# Patient Record
Sex: Female | Born: 1950 | Race: White | Hispanic: No | Marital: Single | Smoking: Former smoker
Health system: Southern US, Community
[De-identification: ages and names within clinical notes are randomized; demographics above are authoritative.]

## PROBLEM LIST (undated history)

## (undated) DIAGNOSIS — M1990 Unspecified osteoarthritis, unspecified site: Secondary | ICD-10-CM

## (undated) DIAGNOSIS — Z8489 Family history of other specified conditions: Secondary | ICD-10-CM

## (undated) DIAGNOSIS — T753XXA Motion sickness, initial encounter: Secondary | ICD-10-CM

## (undated) DIAGNOSIS — C50919 Malignant neoplasm of unspecified site of unspecified female breast: Secondary | ICD-10-CM

## (undated) DIAGNOSIS — K589 Irritable bowel syndrome without diarrhea: Secondary | ICD-10-CM

## (undated) DIAGNOSIS — R112 Nausea with vomiting, unspecified: Secondary | ICD-10-CM

## (undated) DIAGNOSIS — F419 Anxiety disorder, unspecified: Secondary | ICD-10-CM

## (undated) DIAGNOSIS — E278 Other specified disorders of adrenal gland: Secondary | ICD-10-CM

## (undated) DIAGNOSIS — Z9289 Personal history of other medical treatment: Secondary | ICD-10-CM

## (undated) DIAGNOSIS — G629 Polyneuropathy, unspecified: Secondary | ICD-10-CM

## (undated) DIAGNOSIS — J302 Other seasonal allergic rhinitis: Secondary | ICD-10-CM

## (undated) DIAGNOSIS — I890 Lymphedema, not elsewhere classified: Secondary | ICD-10-CM

## (undated) DIAGNOSIS — D126 Benign neoplasm of colon, unspecified: Secondary | ICD-10-CM

## (undated) DIAGNOSIS — C4490 Unspecified malignant neoplasm of skin, unspecified: Secondary | ICD-10-CM

## (undated) DIAGNOSIS — N200 Calculus of kidney: Secondary | ICD-10-CM

## (undated) DIAGNOSIS — K219 Gastro-esophageal reflux disease without esophagitis: Secondary | ICD-10-CM

## (undated) DIAGNOSIS — I10 Essential (primary) hypertension: Secondary | ICD-10-CM

## (undated) DIAGNOSIS — K862 Cyst of pancreas: Secondary | ICD-10-CM

## (undated) DIAGNOSIS — E119 Type 2 diabetes mellitus without complications: Secondary | ICD-10-CM

## (undated) DIAGNOSIS — E7800 Pure hypercholesterolemia, unspecified: Secondary | ICD-10-CM

## (undated) DIAGNOSIS — K449 Diaphragmatic hernia without obstruction or gangrene: Secondary | ICD-10-CM

## (undated) HISTORY — PX: ABDOMINAL HYSTERECTOMY: SHX81

## (undated) HISTORY — PX: COLONOSCOPY: SHX174

## (undated) HISTORY — PX: MASTECTOMY: SHX3

## (undated) HISTORY — PX: TUBAL LIGATION: SHX77

## (undated) HISTORY — PX: BREAST LUMPECTOMY: SHX2

## (undated) HISTORY — PX: TONSILLECTOMY: SUR1361

## (undated) HISTORY — PX: MOHS SURGERY: SUR867

## (undated) HISTORY — PX: INSERTION CENTRAL VENOUS ACCESS DEVICE W/ SUBCUTANEOUS PORT: SUR725

## (undated) HISTORY — PX: SKIN CANCER EXCISION: SHX779

---

## 2007-01-07 ENCOUNTER — Ambulatory Visit: Payer: Self-pay | Admitting: Unknown Physician Specialty

## 2007-03-27 ENCOUNTER — Ambulatory Visit: Payer: Self-pay | Admitting: Unknown Physician Specialty

## 2007-05-22 ENCOUNTER — Ambulatory Visit: Payer: Self-pay | Admitting: Unknown Physician Specialty

## 2008-05-17 ENCOUNTER — Emergency Department: Payer: Self-pay | Admitting: Emergency Medicine

## 2008-06-16 ENCOUNTER — Ambulatory Visit: Payer: Self-pay | Admitting: Specialist

## 2008-08-26 IMAGING — RF DG BARIUM SWALLOW
1 series · 15 of 20 positions shown · non-contrast
Comparison: none

REASON FOR EXAM: Dsyphasia  WITH TABLET
COMMENTS:

PROCEDURE:     FL  - FL BARIUM SWALLOW  - January 07, 2007 [DATE]
RESULT:       Small sliding hiatal hernia is present. There is a mild
prominence of a B-ring. Standardized barium pill stayed at the B-ring for
approximately 15 seconds before passing. No reflux is noted.

[Series 1: run · 13 acquisitions, 15 frames shown]
[im 1/13]
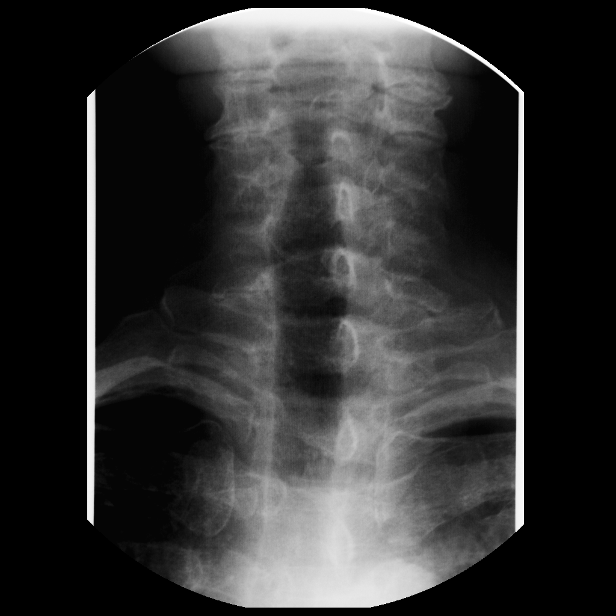
[im 1/13]
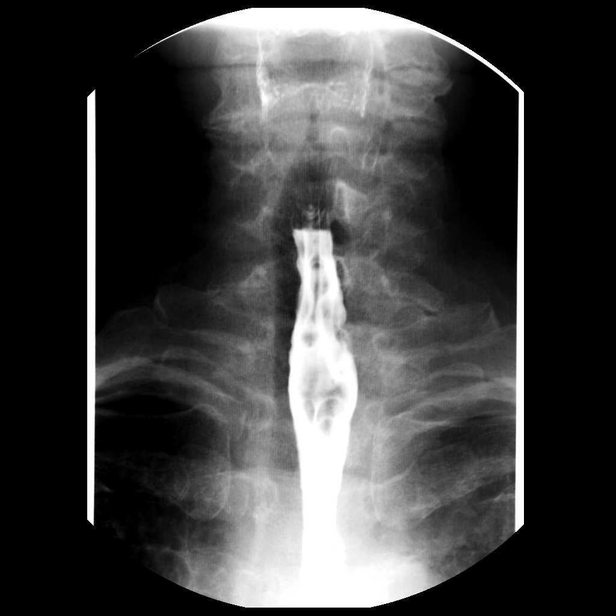
[im 1/13]
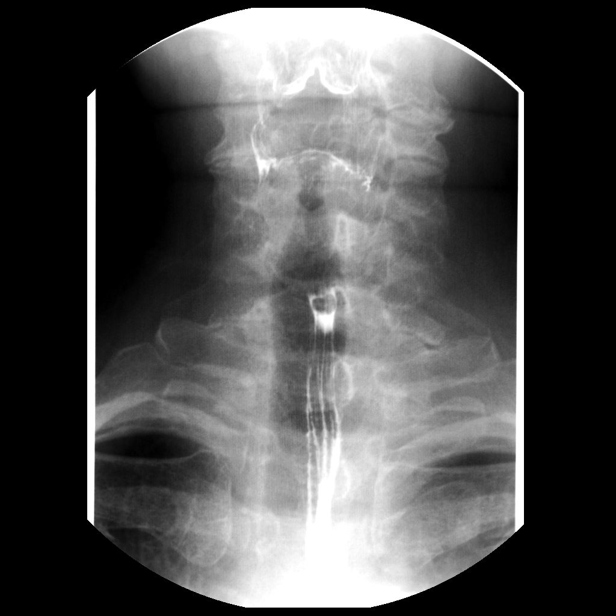
[im 2/13]
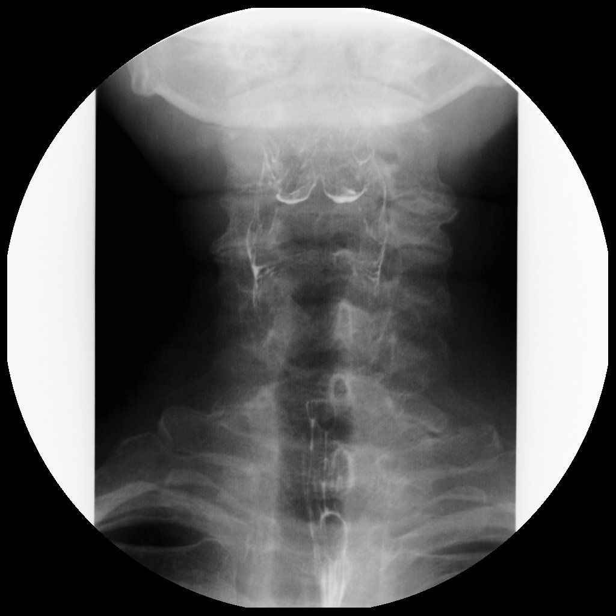
[im 3/13]
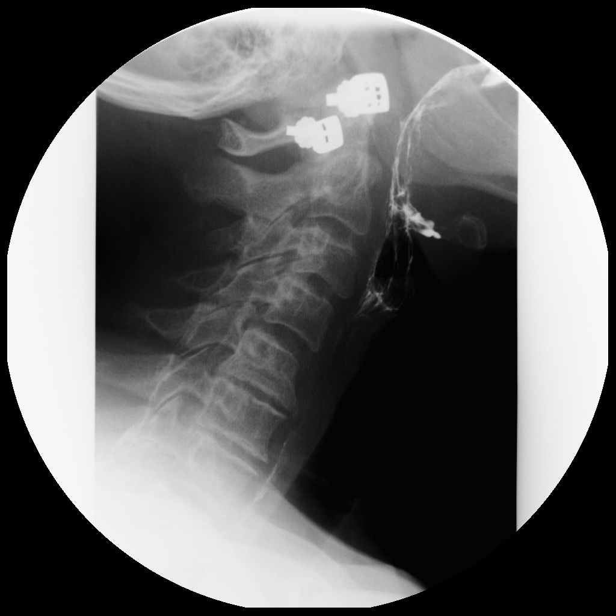
[im 3/13]
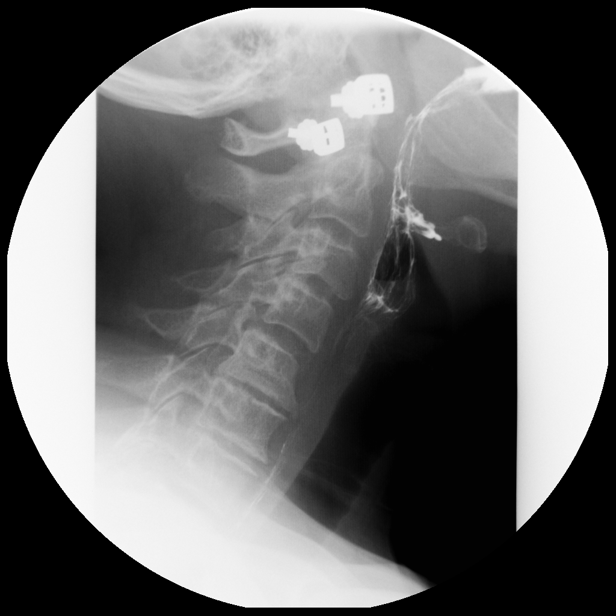
[im 3/13]
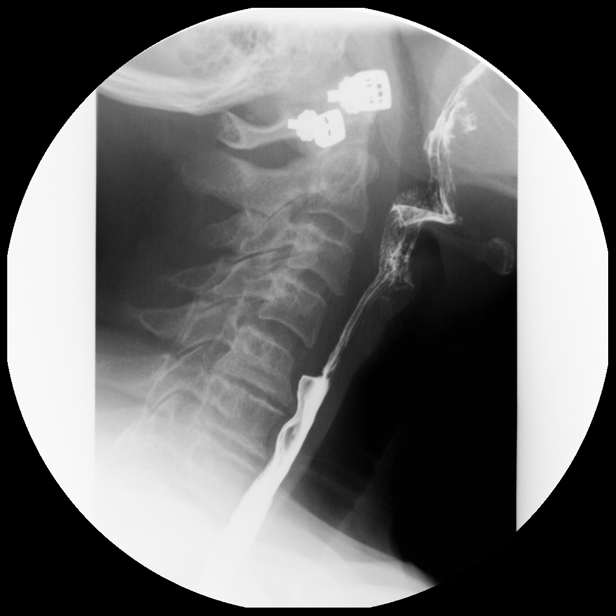
[im 4/13]
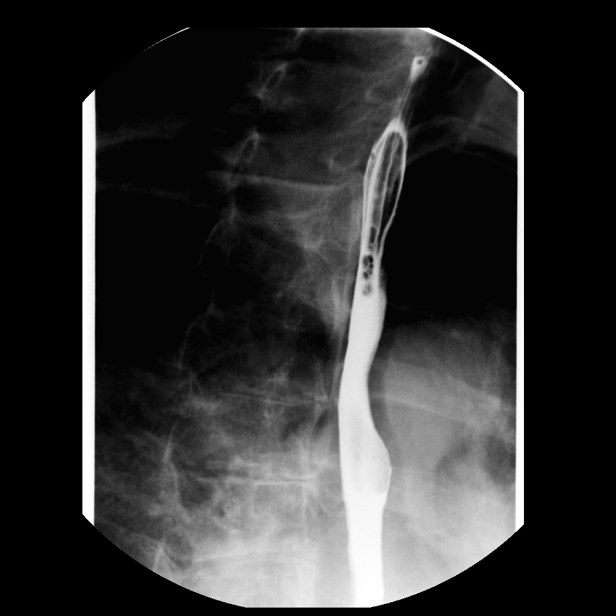
[im 5/13]
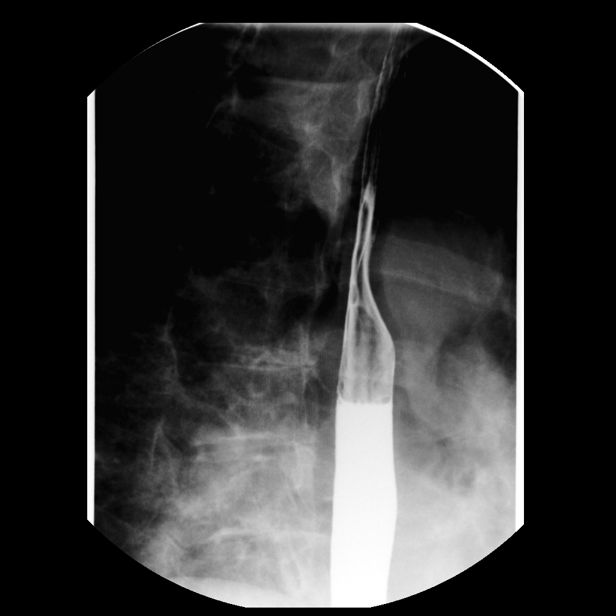
[im 6/13]
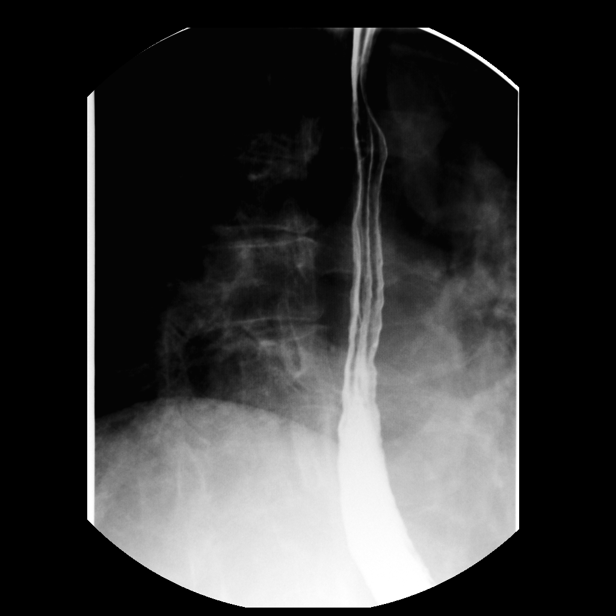
[im 8/13]
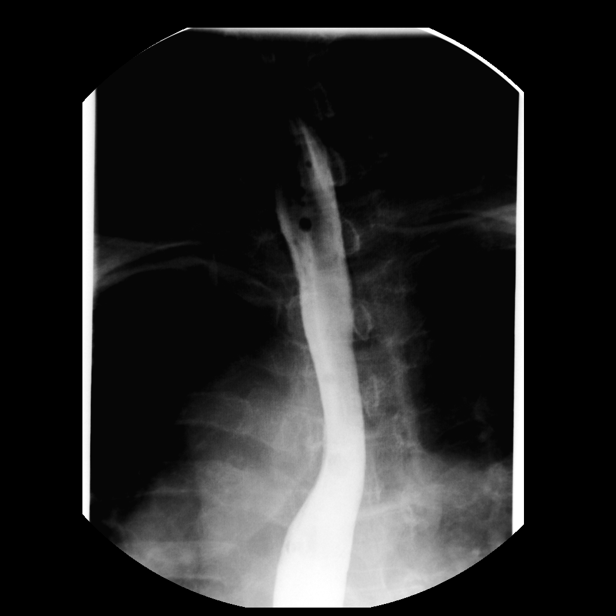
[im 9/13]
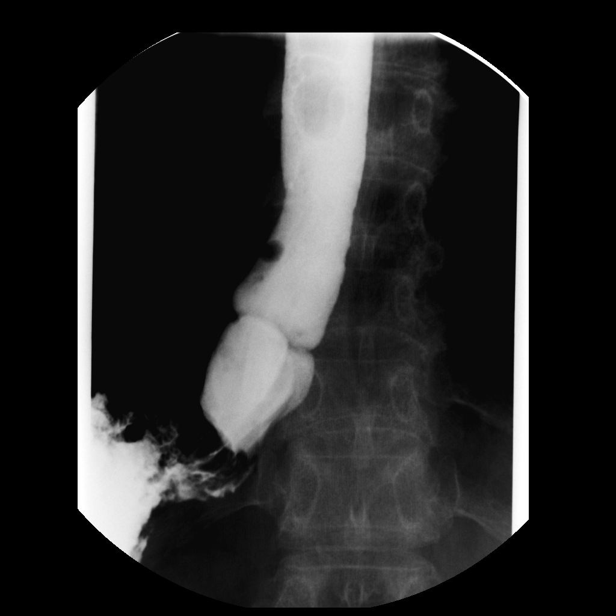
[im 10/13]
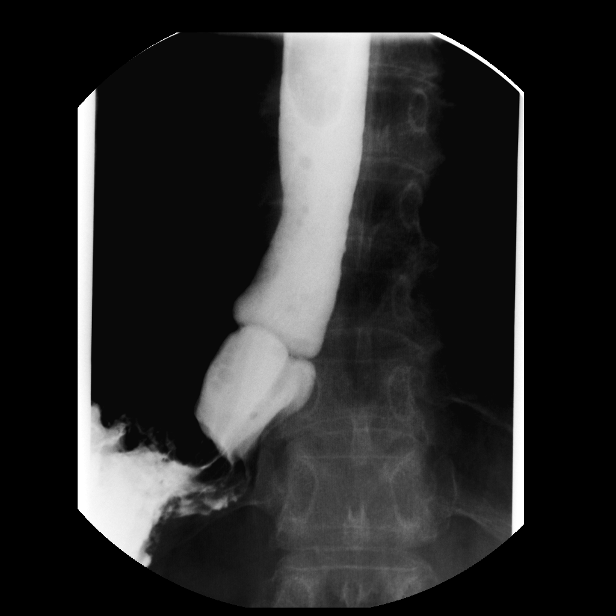
[im 12/13]
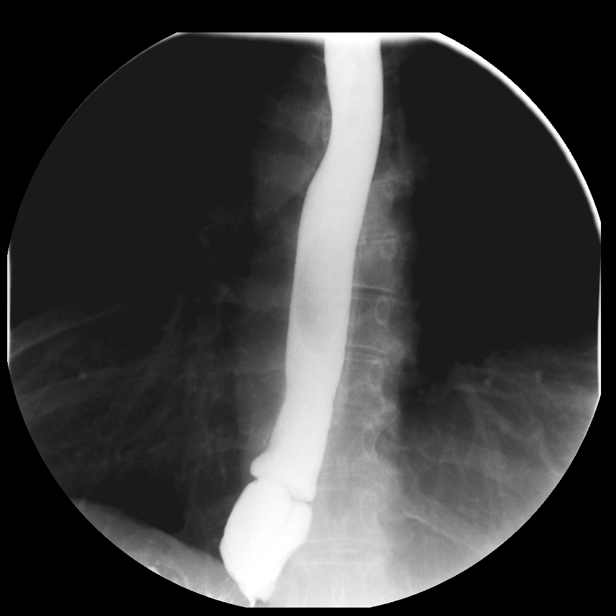
[im 13/13]
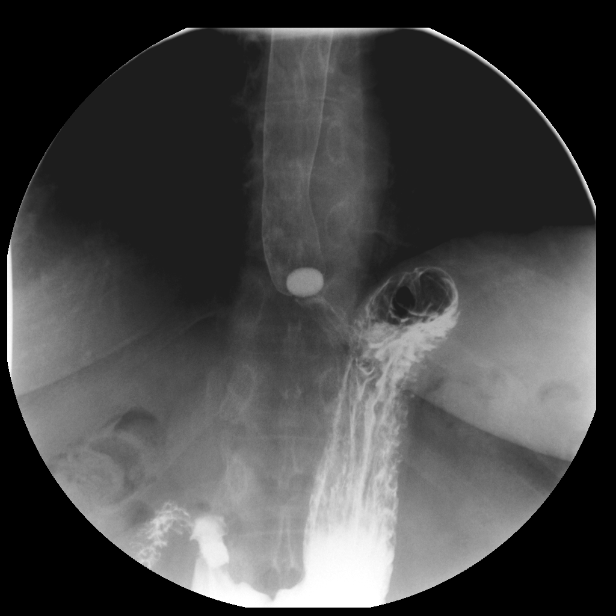

[15 of 20 positions shown; findings below may reference images not displayed]

IMPRESSION: Small hiatal hernia with mild B-ring deformity.  Minimal delay of a barium
tablet was present. No reflux noted. No mass.

## 2008-11-16 ENCOUNTER — Ambulatory Visit: Payer: Self-pay | Admitting: Specialist

## 2009-11-25 ENCOUNTER — Ambulatory Visit: Payer: Self-pay | Admitting: Unknown Physician Specialty

## 2011-03-17 ENCOUNTER — Emergency Department: Payer: Self-pay | Admitting: Internal Medicine

## 2012-11-03 IMAGING — NM NM LUNG SCAN
2 series · 14 of 14 positions shown · non-contrast
Comparison: none

REASON FOR EXAM: cp
COMMENTS:

[Series 1000: lung perfusion · 1.95mm/px · 3 acquisitions, 6 frames shown]
[im 1/3]
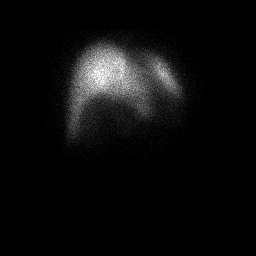
[im 1/3]
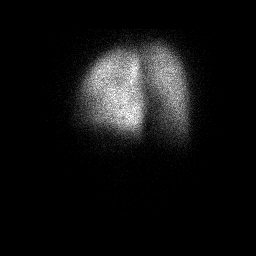
[im 2/3]
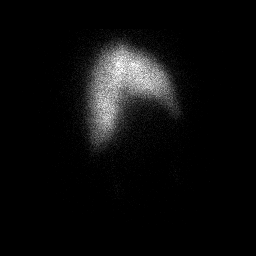
[im 2/3]
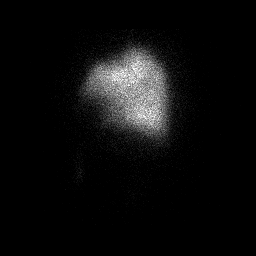
[im 3/3]
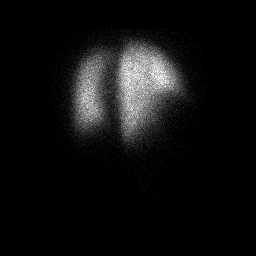
[im 3/3]
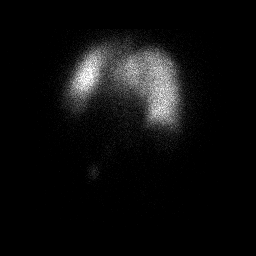

[Series 1000: lung ventilation · 3.90mm/px · 4 acquisitions, 8 frames shown]
[im 1/4]
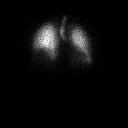
[im 1/4]
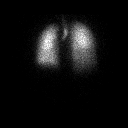
[im 2/4]
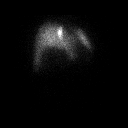
[im 2/4]
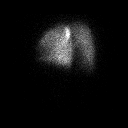
[im 3/4]
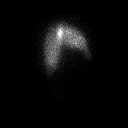
[im 3/4]
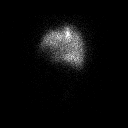
[im 4/4]
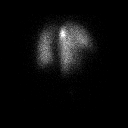
[im 4/4]
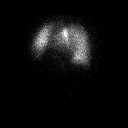

[14 of 14 positions shown; findings below may reference images not displayed]

PROCEDURE:     NM  - NM VQ LUNG SCAN  - [DATE]  [DATE] [DATE]  [DATE]

RESULT:     The patient received 41.546 mCi of technetium 99m labeled
labeled DTPA for the ventilation study via nebulizer. She received 4.373 mCi
of technetium 99m labeled MAA intravenously for the perfusion study.
Comparison is made to a chest x-ray 17 March, 2011.

The distribution  of the radiopharmaceutical on the ventilation study is
within the limits of normal with some central airway activity. The perfusion
images reveal no areas of mismatch.
IMPRESSION: Normal ventilation/perfusion lung scan.

## 2013-02-27 ENCOUNTER — Ambulatory Visit: Payer: Self-pay | Admitting: Family Medicine

## 2014-10-17 IMAGING — US US ABDOMEN LIMITED SLG ORGAN/ASCITES
1 series · 14 of 25 positions shown · non-contrast
Comparison: none

REASON FOR EXAM: elevated alkaline phosphatase   LIVER
COMMENTS:

[Series 1: us abdomen limited slg organ/ascites · 0.28mm/px · 14 of 60 slices shown]
[im 1/60]
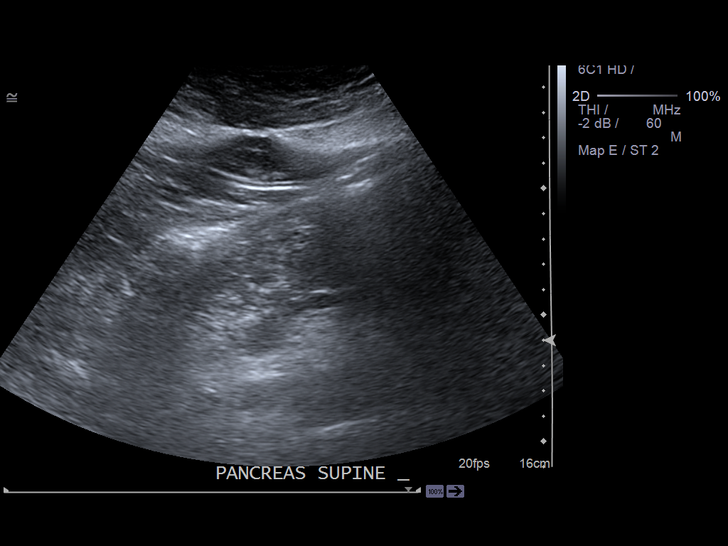
[im 5/60]
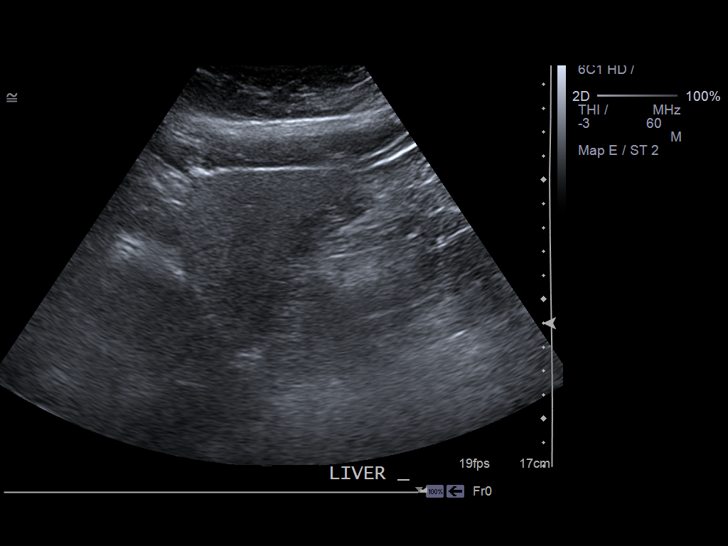
[im 10/60]
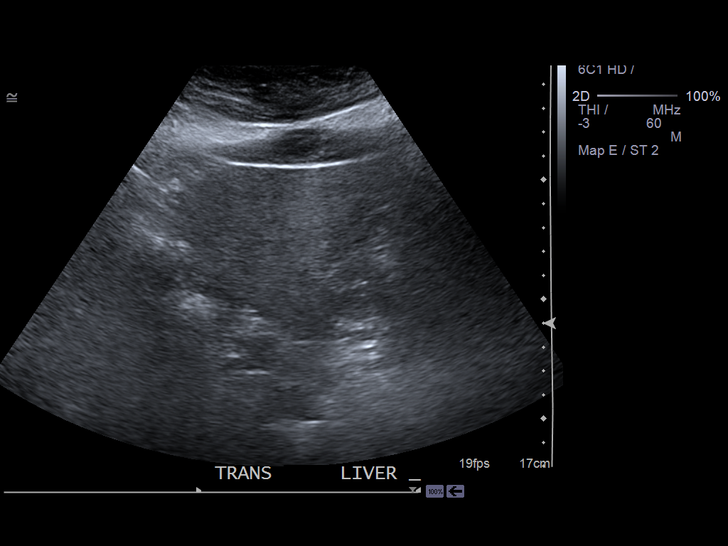
[im 15/60]
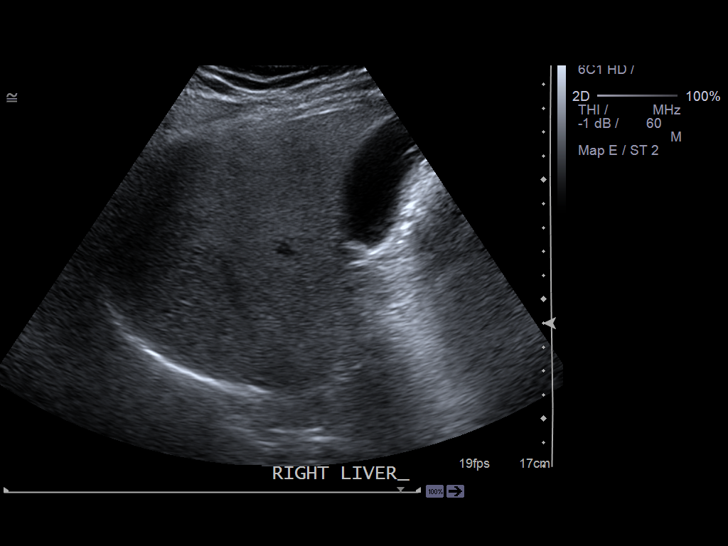
[im 20/60]
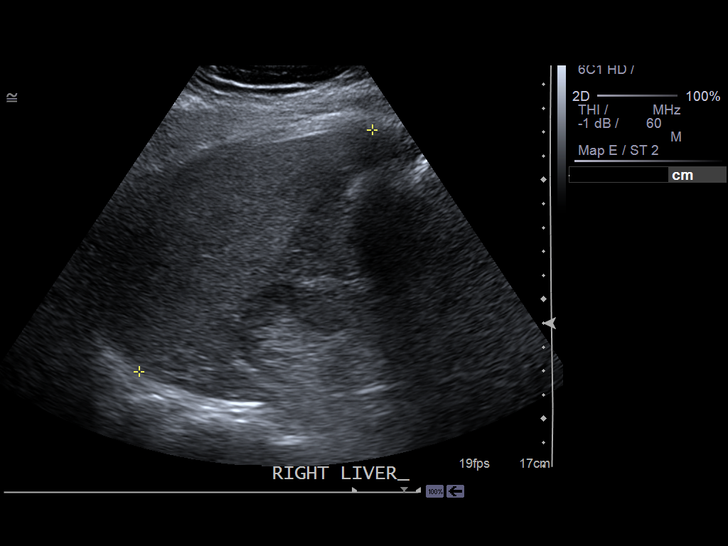
[im 23/60]
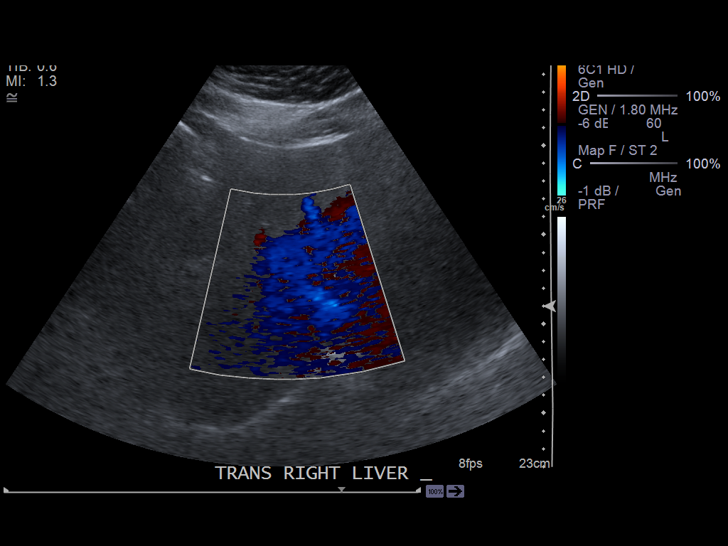
[im 28/60]
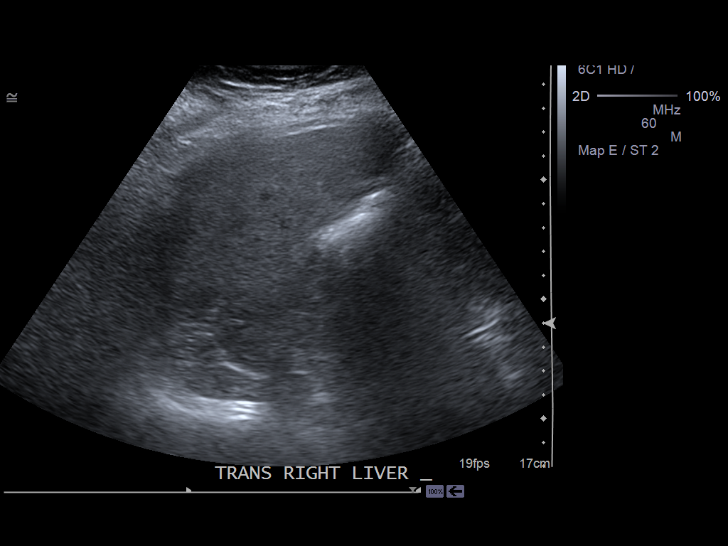
[im 32/60]
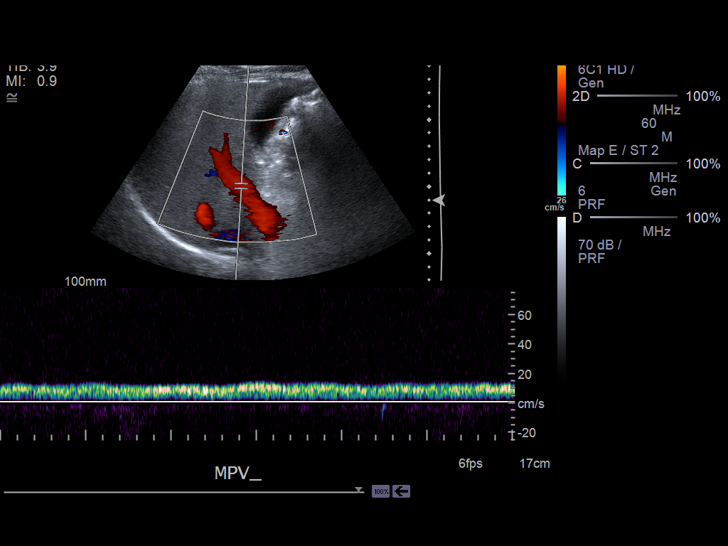
[im 37/60]
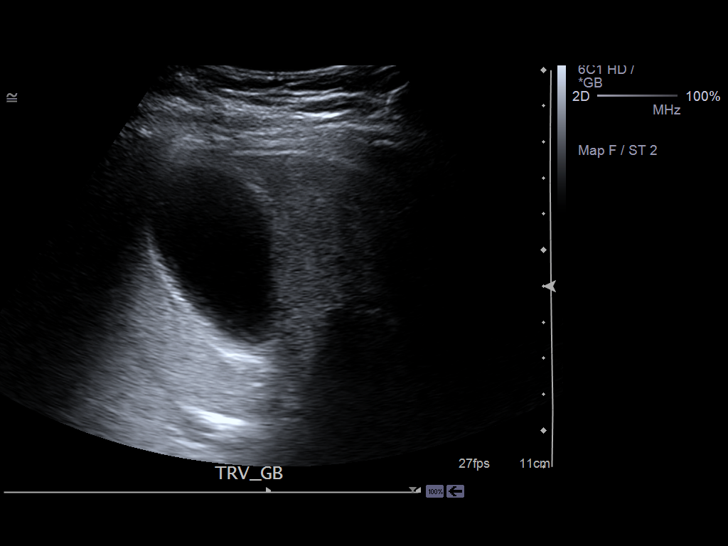
[im 40/60]
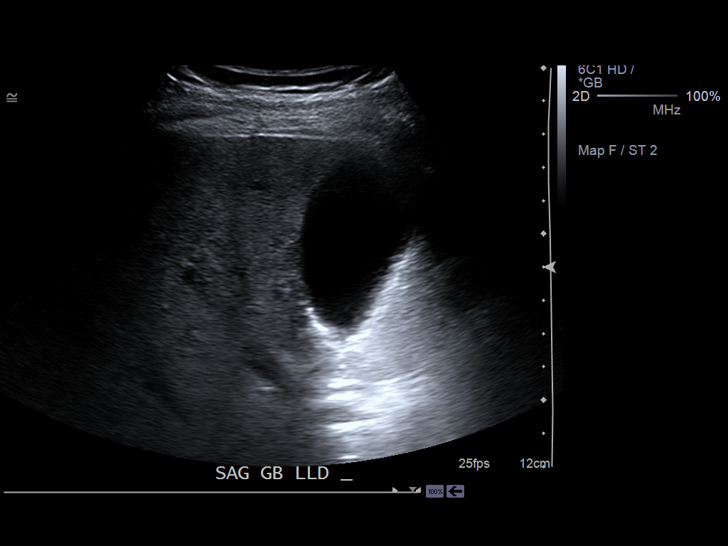
[im 45/60]
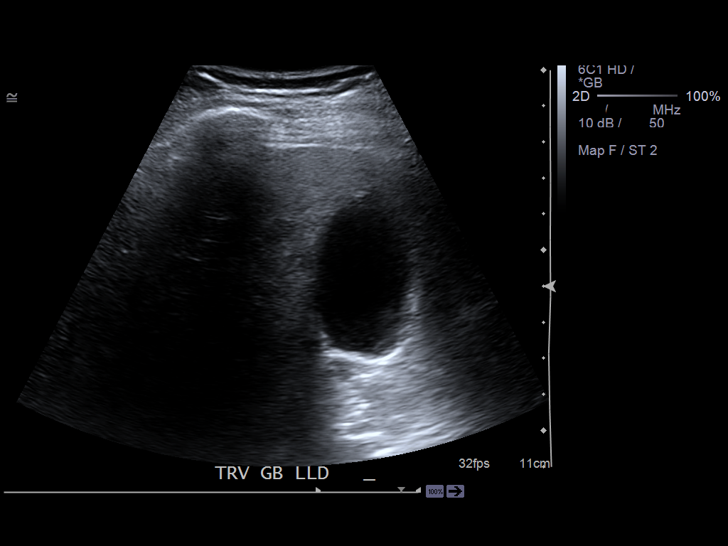
[im 50/60]
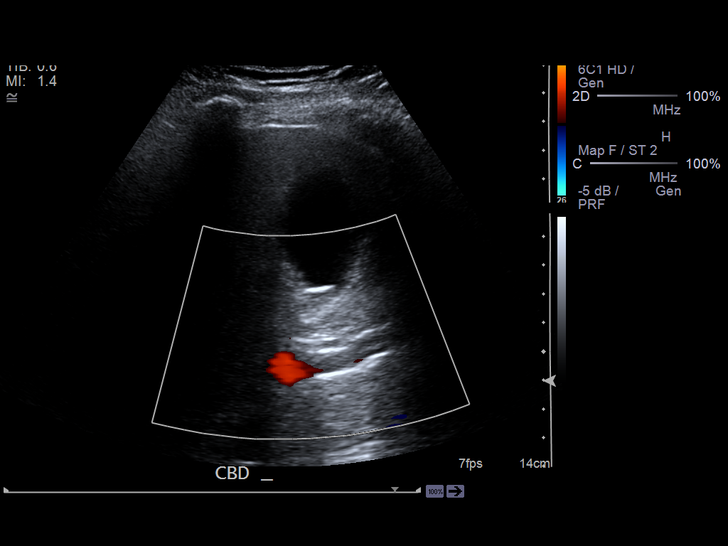
[im 55/60]
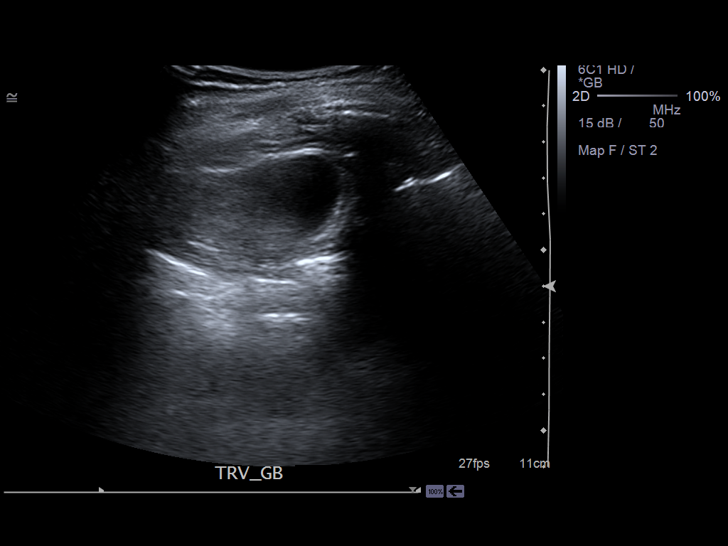
[im 60/60]
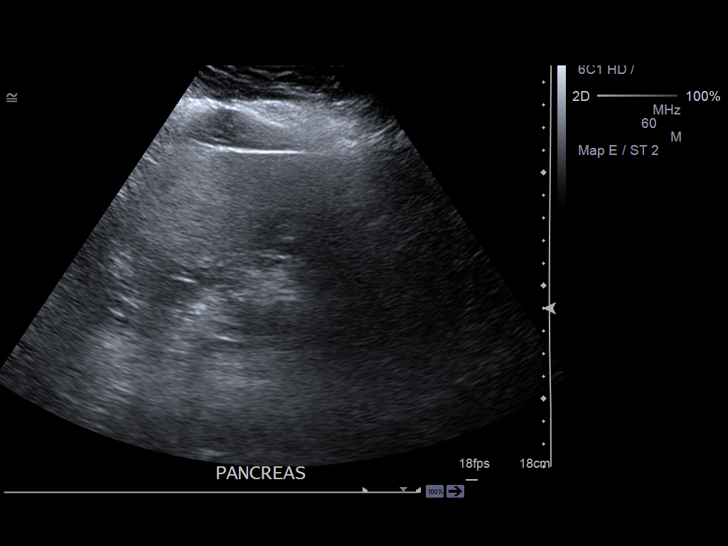

[14 of 25 positions shown; findings below may reference images not displayed]

PROCEDURE:     CHOP KING - CHOP KING ABDOMEN LTD 1 ORGAN OR QUAD  - February 27, 2013  [DATE]

RESULT:     The liver exhibits mildly increased echotexture suggesting fatty
infiltration. Portal venous flow is normal in direction toward the liver.
The gallbladder is adequately distended with no evidence of stones, wall
thickening, or pericholecystic fluid. There is no positive sonographic
Murphy's sign. The common bile duct measures 4 mm in diameter. The pancreas
is largely obscured by bowel gas.
IMPRESSION: 1. There are fatty infiltrative changes of the liver.
2. The gallbladder and common bile duct are normal in appearance.
3. The pancreas is largely obscured by bowel gas.

[REDACTED]

## 2015-02-01 ENCOUNTER — Other Ambulatory Visit
Admission: RE | Admit: 2015-02-01 | Discharge: 2015-02-01 | Disposition: A | Discharge status: Home / Self Care | Payer: Medicare Other | Source: Ambulatory Visit | Attending: Ophthalmology | Admitting: Ophthalmology

## 2015-02-01 DIAGNOSIS — H02403 Unspecified ptosis of bilateral eyelids: Secondary | ICD-10-CM | POA: Diagnosis present

## 2015-02-03 LAB — LATEX, IGE: Latex: <0.1 kU/L

## 2015-02-08 ENCOUNTER — Encounter: Payer: Self-pay | Admitting: *Deleted

## 2015-02-09 ENCOUNTER — Ambulatory Visit
Admission: RE | Admit: 2015-02-09 | Discharge: 2015-02-09 | Disposition: A | Discharge status: Home / Self Care | Payer: Medicare Other | Source: Ambulatory Visit | Attending: Anesthesiology | Admitting: Anesthesiology

## 2015-02-09 DIAGNOSIS — I890 Lymphedema, not elsewhere classified: Secondary | ICD-10-CM | POA: Diagnosis not present

## 2015-02-09 DIAGNOSIS — Z888 Allergy status to other drugs, medicaments and biological substances status: Secondary | ICD-10-CM | POA: Diagnosis not present

## 2015-02-09 DIAGNOSIS — F419 Anxiety disorder, unspecified: Secondary | ICD-10-CM | POA: Diagnosis not present

## 2015-02-09 DIAGNOSIS — H02403 Unspecified ptosis of bilateral eyelids: Secondary | ICD-10-CM | POA: Diagnosis not present

## 2015-02-09 DIAGNOSIS — Z9221 Personal history of antineoplastic chemotherapy: Secondary | ICD-10-CM | POA: Diagnosis not present

## 2015-02-09 DIAGNOSIS — H02834 Dermatochalasis of left upper eyelid: Secondary | ICD-10-CM | POA: Diagnosis not present

## 2015-02-09 DIAGNOSIS — M1990 Unspecified osteoarthritis, unspecified site: Secondary | ICD-10-CM | POA: Diagnosis not present

## 2015-02-09 DIAGNOSIS — H02831 Dermatochalasis of right upper eyelid: Secondary | ICD-10-CM | POA: Diagnosis not present

## 2015-02-09 DIAGNOSIS — Z853 Personal history of malignant neoplasm of breast: Secondary | ICD-10-CM | POA: Diagnosis not present

## 2015-02-09 DIAGNOSIS — K219 Gastro-esophageal reflux disease without esophagitis: Secondary | ICD-10-CM | POA: Diagnosis not present

## 2015-02-09 DIAGNOSIS — I10 Essential (primary) hypertension: Secondary | ICD-10-CM | POA: Diagnosis not present

## 2015-02-09 DIAGNOSIS — Z87891 Personal history of nicotine dependence: Secondary | ICD-10-CM | POA: Diagnosis not present

## 2015-02-09 DIAGNOSIS — Z7982 Long term (current) use of aspirin: Secondary | ICD-10-CM | POA: Diagnosis not present

## 2015-02-09 DIAGNOSIS — E1140 Type 2 diabetes mellitus with diabetic neuropathy, unspecified: Secondary | ICD-10-CM | POA: Diagnosis not present

## 2015-02-09 DIAGNOSIS — Z881 Allergy status to other antibiotic agents status: Secondary | ICD-10-CM | POA: Diagnosis not present

## 2015-02-11 NOTE — Discharge Instructions (Signed)
INSTRUCTIONS FOLLOWING OCULOPLASTIC SURGERY °AMY M. FOWLER, MD ° °AFTER YOUR EYE SURGERY, THER ARE MANY THINGS THWIHC YOU, THE PATIENT, CAN DO TO ASSURE THE BEST POSSIBLE RESULT FROM YOUR OPERATION.  THIS SHEET SHOULD BE REFERRED TO WHENEVER QUESTIONS ARISE.  IF THERE ARE ANY QUESTIONS NOT ANSWERED HERE, DO NOT HESITATE TO CALL OUR OFFICE AT [phone] OR [phone].  THERE IS ALWAYS [facility] AVAILABLE TO CALL IF QUESTIONS OR PROBLEMS ARISE. ° °VISION: Your vision may be blurred and out of focus after surgery until you are able to stop using your ointment, swelling resolves and your eye(s) heal. This may take 1 to 2 weeks at the least.  If your vision becomes gradually more dim or dark, this is not normal and you need to call our office immediately. ° °EYE CARE: For the first 48 hours after surgery, use ice packs frequently - “20 minutes on, 20 minutes off” - to help reduce swelling and bruising.  Small bags of frozen peas or corn make good ice packs along with cloths soaked in ice water.  If you are wearing a patch or other type of dressing following surgery, keep this on for the amount of time specified by your doctor.  For the first week following surgery, you will need to treat your stitches with great care.  If is OK to shower, but take care to not allow soapy water to run into your eye(s) to help reduce changes of infection.  You may gently clean the eyelashes and around the eye(s) with cotton balls and sterile water, BUT DO NOT RUB THE STITCHES VIGOROUSLY.  Keeping your stitches moist with ointment will help promote healing with minimal scar formation. ° °ACTIVITY: When you leave the surgery center, you should go home, rest and be inactive.  The eye(s) may feel scratchy and keeping the eyes closed will allow for faster healing.  The first week following surgery, avoid straining (anything making the face turn red) or lifting over 20 pounds.  Additionally, avoid bending which causes your head to go below  your waist.  Using your eyes will NOT harm them, so feel free to read, watch television, use the computer, etc as desired.  Driving depends on each individual, so check with your doctor if you have questions about driving. ° °MEDICATIONS:  You will be given a prescription for an ointment to use 4 times a day on your stitches.  You can use the ointment in your eyes if they feel scratchy or irritated.  If you eyelid(s) don’t close completely when you sleep, put some ointment in your eyes before bedtime. ° °EMERGENCY: If you experience SEVERE EYE PAIN OR HEADACHE UNRELIEVED BY TYLENOL OR PERCOCET, NAUSEA OR VOMITING, WORSENING REDNESS, OR WORSENING VISION (ESPECIALLY VISION THAT WA INITIALLY BETTER) CALL [phone] OR [phone] DURING BUSINESS HOURS OR AFTER HOURS. ° °General Anesthesia, Care After °Refer to this sheet in the next few weeks. These instructions provide you with information on caring for yourself after your procedure. Your health care provider may also give you more specific instructions. Your treatment has been planned according to current medical practices, but problems sometimes occur. Call your health care provider if you have any problems or questions after your procedure. °WHAT TO EXPECT AFTER THE PROCEDURE °After the procedure, it is typical to experience: °· Sleepiness. °· Nausea and vomiting. °HOME CARE INSTRUCTIONS °· For the first 24 hours after general anesthesia: °¨ Have a responsible person with you. °¨ Do not drive a car. If you are   alone, do not take public transportation. °¨ Do not drink alcohol. °¨ Do not take medicine that has not been prescribed by your health care provider. °¨ Do not sign important papers or make important decisions. °¨ You may resume a normal diet and activities as directed by your health care provider. °· Change bandages (dressings) as directed. °· If you have questions or problems that seem related to general anesthesia, call the hospital and ask for the  anesthetist or anesthesiologist on call. °SEEK MEDICAL CARE IF: °· You have nausea and vomiting that continue the day after anesthesia. °· You develop a rash. °SEEK IMMEDIATE MEDICAL CARE IF:  °· You have difficulty breathing. °· You have chest pain. °· You have any allergic problems. °Document Released: 09/04/2000 Document Revised: 06/03/2013 Document Reviewed: 12/12/2012 °Exit[employer]® Patient Information ©2015 [employer]. This information is not intended to replace advice given to you by your health care provider. Make sure you discuss any questions you have with your health care provider. ° °

## 2015-02-16 ENCOUNTER — Encounter
Admission: RE | Disposition: A | Discharge status: Home / Self Care | Payer: Self-pay | Source: Ambulatory Visit | Attending: Ophthalmology

## 2015-02-16 ENCOUNTER — Ambulatory Visit: Payer: Medicare Other | Admitting: Anesthesiology

## 2015-02-16 ENCOUNTER — Ambulatory Visit
Admission: RE | Admit: 2015-02-16 | Discharge: 2015-02-16 | Disposition: A | Discharge status: Home / Self Care | Payer: Medicare Other | Source: Ambulatory Visit | Attending: Ophthalmology | Admitting: Ophthalmology

## 2015-02-16 DIAGNOSIS — Z853 Personal history of malignant neoplasm of breast: Secondary | ICD-10-CM | POA: Insufficient documentation

## 2015-02-16 DIAGNOSIS — H02403 Unspecified ptosis of bilateral eyelids: Secondary | ICD-10-CM | POA: Insufficient documentation

## 2015-02-16 DIAGNOSIS — Z888 Allergy status to other drugs, medicaments and biological substances status: Secondary | ICD-10-CM | POA: Insufficient documentation

## 2015-02-16 DIAGNOSIS — E1140 Type 2 diabetes mellitus with diabetic neuropathy, unspecified: Secondary | ICD-10-CM | POA: Insufficient documentation

## 2015-02-16 DIAGNOSIS — I890 Lymphedema, not elsewhere classified: Secondary | ICD-10-CM | POA: Insufficient documentation

## 2015-02-16 DIAGNOSIS — I10 Essential (primary) hypertension: Secondary | ICD-10-CM | POA: Insufficient documentation

## 2015-02-16 DIAGNOSIS — Z87891 Personal history of nicotine dependence: Secondary | ICD-10-CM | POA: Insufficient documentation

## 2015-02-16 DIAGNOSIS — H02834 Dermatochalasis of left upper eyelid: Secondary | ICD-10-CM | POA: Insufficient documentation

## 2015-02-16 DIAGNOSIS — F419 Anxiety disorder, unspecified: Secondary | ICD-10-CM | POA: Insufficient documentation

## 2015-02-16 DIAGNOSIS — H02831 Dermatochalasis of right upper eyelid: Secondary | ICD-10-CM | POA: Insufficient documentation

## 2015-02-16 DIAGNOSIS — Z7982 Long term (current) use of aspirin: Secondary | ICD-10-CM | POA: Insufficient documentation

## 2015-02-16 DIAGNOSIS — K219 Gastro-esophageal reflux disease without esophagitis: Secondary | ICD-10-CM | POA: Insufficient documentation

## 2015-02-16 DIAGNOSIS — M1990 Unspecified osteoarthritis, unspecified site: Secondary | ICD-10-CM | POA: Insufficient documentation

## 2015-02-16 DIAGNOSIS — Z9221 Personal history of antineoplastic chemotherapy: Secondary | ICD-10-CM | POA: Insufficient documentation

## 2015-02-16 DIAGNOSIS — Z881 Allergy status to other antibiotic agents status: Secondary | ICD-10-CM | POA: Insufficient documentation

## 2015-02-16 HISTORY — DX: Type 2 diabetes mellitus without complications: E11.9

## 2015-02-16 HISTORY — DX: Other seasonal allergic rhinitis: J30.2

## 2015-02-16 HISTORY — DX: Unspecified osteoarthritis, unspecified site: M19.90

## 2015-02-16 HISTORY — DX: Unspecified malignant neoplasm of skin, unspecified: C44.90

## 2015-02-16 HISTORY — DX: Malignant neoplasm of unspecified site of unspecified female breast: C50.919

## 2015-02-16 HISTORY — DX: Gastro-esophageal reflux disease without esophagitis: K21.9

## 2015-02-16 HISTORY — DX: Diaphragmatic hernia without obstruction or gangrene: K44.9

## 2015-02-16 HISTORY — PX: BROW LIFT: SHX178

## 2015-02-16 HISTORY — DX: Pure hypercholesterolemia, unspecified: E78.00

## 2015-02-16 HISTORY — DX: Anxiety disorder, unspecified: F41.9

## 2015-02-16 HISTORY — DX: Polyneuropathy, unspecified: G62.9

## 2015-02-16 HISTORY — DX: Motion sickness, initial encounter: T75.3XXA

## 2015-02-16 HISTORY — PX: PTOSIS REPAIR: SHX6568

## 2015-02-16 HISTORY — DX: Essential (primary) hypertension: I10

## 2015-02-16 HISTORY — DX: Lymphedema, not elsewhere classified: I89.0

## 2015-02-16 HISTORY — DX: Calculus of kidney: N20.0

## 2015-02-16 LAB — GLUCOSE, CAPILLARY
Glucose-Capillary: 162 mg/dL — ABNORMAL HIGH (ref 65–99)
Glucose-Capillary: 128 mg/dL — ABNORMAL HIGH (ref 65–99)

## 2015-02-16 SURGERY — BLEPHAROPLASTY
Anesthesia: Monitor Anesthesia Care | Laterality: Bilateral | Wound class: Clean

## 2015-02-16 MED ORDER — LIDOCAINE HCL (CARDIAC) 20 MG/ML IV SOLN
INTRAVENOUS | Status: DC | PRN
  Administered 2015-02-16: 40 mg via INTRAVENOUS

## 2015-02-16 MED ORDER — BACITRACIN 500 UNIT/GM OP OINT: TOPICAL_OINTMENT | OPHTHALMIC | Status: DC

## 2015-02-16 MED ORDER — BSS IO SOLN
INTRAOCULAR | Status: DC | PRN
  Administered 2015-02-16: 15 mL

## 2015-02-16 MED ORDER — MIDAZOLAM HCL 2 MG/2ML IJ SOLN
INTRAMUSCULAR | Status: DC | PRN
  Administered 2015-02-16: 2 mg via INTRAVENOUS

## 2015-02-16 MED ORDER — OXYCODONE HCL 5 MG/5ML PO SOLN: 5.0000 mg | Freq: Once | ORAL | Status: DC | PRN

## 2015-02-16 MED ORDER — PROPOFOL INFUSION 10 MG/ML OPTIME
INTRAVENOUS | Status: DC | PRN
  Administered 2015-02-16: 25 ug/kg/min via INTRAVENOUS

## 2015-02-16 MED ORDER — LACTATED RINGERS IV SOLN
INTRAVENOUS | Status: DC
  Administered 2015-02-16: 07:00:00 via INTRAVENOUS

## 2015-02-16 MED ORDER — OXYCODONE-ACETAMINOPHEN 5-325 MG PO TABS: 1.0000 | ORAL_TABLET | ORAL | Status: DC | PRN

## 2015-02-16 MED ORDER — LIDOCAINE-EPINEPHRINE 2 %-1:100000 IJ SOLN
INTRAMUSCULAR | Status: DC | PRN
  Administered 2015-02-16: 2.5 mL via OPHTHALMIC

## 2015-02-16 MED ORDER — ERYTHROMYCIN 5 MG/GM OP OINT
TOPICAL_OINTMENT | OPHTHALMIC | Status: DC | PRN
  Administered 2015-02-16: 1
  Administered 2015-02-16: 1 via OPHTHALMIC

## 2015-02-16 MED ORDER — TETRACAINE HCL 0.5 % OP SOLN
OPHTHALMIC | Status: DC | PRN
  Administered 2015-02-16: 2 [drp] via OPHTHALMIC
  Administered 2015-02-16: 1 [drp] via OPHTHALMIC

## 2015-02-16 MED ORDER — ALFENTANIL 500 MCG/ML IJ INJ
INJECTION | INTRAMUSCULAR | Status: DC | PRN
  Administered 2015-02-16: 700 ug via INTRAVENOUS
  Administered 2015-02-16: 300 ug via INTRAVENOUS

## 2015-02-16 MED ORDER — OXYCODONE HCL 5 MG PO TABS: 5.0000 mg | ORAL_TABLET | Freq: Once | ORAL | Status: DC | PRN

## 2015-02-16 MED ORDER — OXYMETAZOLINE HCL 0.05 % NA SOLN: 1.0000 | Freq: Two times a day (BID) | NASAL | Status: DC

## 2015-02-16 SURGICAL SUPPLY — 36 items
APPLICATOR COTTON TIP WD 3 STR (MISCELLANEOUS) ×6 IMPLANT
BLADE SURG 15 STRL LF DISP TIS (BLADE) ×1 IMPLANT
BLADE SURG 15 STRL SS (BLADE) ×2
CORD BIP STRL DISP 12FT (MISCELLANEOUS) ×3 IMPLANT
DRAPE HEAD BAR (DRAPES) ×3 IMPLANT
GAUZE SPONGE 4X4 12PLY STRL (GAUZE/BANDAGES/DRESSINGS) ×3 IMPLANT
GAUZE SPONGE NON-WVN 2X2 STRL (MISCELLANEOUS) ×10 IMPLANT
GLOVE SURG LX 7.0 MICRO (GLOVE) ×4
GLOVE SURG LX STRL 7.0 MICRO (GLOVE) ×2 IMPLANT
MARKER SKIN XFINE TIP W/RULER (MISCELLANEOUS) ×3 IMPLANT
NEEDLE FILTER BLUNT 18X 1/2SAF (NEEDLE) ×2
NEEDLE FILTER BLUNT 18X1 1/2 (NEEDLE) ×1 IMPLANT
NEEDLE HYPO 30X.5 LL (NEEDLE) ×6 IMPLANT
PACK DRAPE NASAL/ENT (PACKS) ×3 IMPLANT
SOL PREP PVP 2OZ (MISCELLANEOUS) ×3
SOLUTION PREP PVP 2OZ (MISCELLANEOUS) ×1 IMPLANT
SPONGE VERSALON 2X2 STRL (MISCELLANEOUS) ×20
SUT CHROMIC 4-0 (SUTURE)
SUT CHROMIC 4-0 M2 12X2 ARM (SUTURE)
SUT CHROMIC 5 0 P 3 (SUTURE) IMPLANT
SUT ETHILON 4 0 CL P 3 (SUTURE) IMPLANT
SUT MERSILENE 4-0 S-2 (SUTURE) IMPLANT
SUT PDS AB 4-0 P3 18 (SUTURE) IMPLANT
SUT PLAIN GUT (SUTURE) ×3 IMPLANT
SUT PROLENE 5 0 P 3 (SUTURE) ×3 IMPLANT
SUT PROLENE 6 0 P 1 18 (SUTURE) ×3 IMPLANT
SUT SILK 4 0 G 3 (SUTURE) IMPLANT
SUT VIC AB 5-0 P-3 18X BRD (SUTURE) IMPLANT
SUT VIC AB 5-0 P3 18 (SUTURE)
SUT VICRYL 6-0  S14 CTD (SUTURE)
SUT VICRYL 6-0 S14 CTD (SUTURE) IMPLANT
SUT VICRYL 7 0 TG140 8 (SUTURE) IMPLANT
SUTURE CHRMC 4-0 M2 12X2 ARM (SUTURE) IMPLANT
SYR 3ML LL SCALE MARK (SYRINGE) ×3 IMPLANT
SYRINGE 10CC LL (SYRINGE) ×3 IMPLANT
WATER STERILE IRR 500ML POUR (IV SOLUTION) ×3 IMPLANT

## 2015-02-16 NOTE — H&P (Signed)
  See scanned H&P in chart.

## 2015-02-16 NOTE — Op Note (Signed)
Preoperative Diagnosis:  1. Visually significant blepharoptosis both Upper Eyelid(s) 2. Visually significant dermatochalasis both Upper Eyelid(s)  Postoperative Diagnosis:  Same.  Procedure(s) Performed:   1. Blepharoptosis repair with levator aponeurosis advancement both Upper Eyelid(s) 2. Upper eyelid blepharoplasty with excess skin excision  both Upper Eyelid(s)  Teaching Surgeon: Philis Pique. Vickki Muff, M.D.  Assistants: none  Anesthesia: MAC  Specimens: None.  Estimated Blood Loss: Minimal.  Complications: None.  Operative Findings: None Dictated  Procedure:   Allergies were reviewed and the patient Adhesive and Clindamycin/lincomycin.   After the risks, benefits, complications and alternatives were discussed with the patient, appropriate informed consent was obtained.  While seated in an upright position and looking in primary gaze, the mid pupillary line was marked on the upper eyelid margins bilaterally. The patient was then brought to the operating suite and reclined supine.  Timeout was conducted and the patient was sedated.  Local anesthetic consisting of a 50-50 mixture of 2% lidocaine with epinephrine and 0.75% bupivacaine with added Hylenex was injected subcutaneously to both upper eyelid(s). After adequate local was instilled, the patient was prepped and draped in the usual sterile fashion for eyelid surgery.   Attention was turned to the upper eyelids. A 9 mm upper eyelid crease incision line was marked with calipers on both upper eyelid(s).  A pinch test was used to estimate the amount of excess skin to remove and this was marked in standard blepharoplasty style fashion. Attention was turned to the  right upper eyelid. A #15 blade was used to open the premarked incision line. A skin only flap was excised and hemostasis was obtained with bipolar cautery.    Westcott scissors were then used to transect through orbicularis down to the tarsal plate. Epitarsus was dissected to  create a smooth surface to suture to. Dissection was then carried superiorly in the plane between orbicularis and orbital septum. Once the preaponeurotic fat pocket was identified, the orbital septum was opened. This revealed the levator and its aponeurosis.    Attention was then turned to the opposite eyelid where the same procedure was performed in the same manner. Hemostasis was obtained with bipolar cautery throughout.   3 interrupted 6-0 Prolene sutures were then passed partial thickness through the tarsal plates of both upper eyelid(s). These sutures were placed in line with the mid pupillary, medial limbal, and lateral limbal lines. The sutures were fixed to the levator aponeurosis and adjusted until a nice lid height and contour were achieved. Once nice symmetry was achieved, the skin incisions were closed with a running 6-0 fast absorbing plain suture. The patient tolerated the procedure well.  Bacitracin ophthalmic ointment was applied to the incision site(s) followed by ice packs. The patient was taken to the recovery area where she recovered without difficulty.  Post-Op Plan/Instructions:  The patient was instructed to use ice packs frequently for the next 48 hours. She was instructed to use bacitracin ointment on her incisions 4 times a day for the next 12 to 14 days. Shewas given a prescription for Percocet for pain control should Tylenol not be effective. She was asked to to follow up at the [facility] in [place], [place] in 2 weeks' time or sooner as needed for problems.  Teaching Surgeon Attestation: None  Kylieann Eagles M. Vickki Muff, M.D. Attending,Ophthalmology

## 2015-02-16 NOTE — Anesthesia Procedure Notes (Signed)
Procedure Name: MAC Performed by: Chrsitopher Wik Pre-anesthesia Checklist: Patient identified, Emergency Drugs available, Suction available, Timeout performed and Patient being monitored Patient Re-evaluated:Patient Re-evaluated prior to inductionOxygen Delivery Method: Nasal cannula Placement Confirmation: positive ETCO2     

## 2015-02-16 NOTE — Interval H&P Note (Signed)
History and Physical Interval Note:  02/16/2015 7:42 AM  Jacqueline Warren  has presented today for surgery, with the diagnosis of H02.831 AND H02.834 DERMATOCHALASIS H02.403 BLEPHAROPTOSIS  The various methods of treatment have been discussed with the patient and family. After consideration of risks, benefits and other options for treatment, the patient has consented to  Procedure(s) with comments: BLEPHAROPLASTY UPPER EYELID W/EXCESS SKIN (Bilateral) - DIABETIC - oral meds PTOSIS REPAIR (Bilateral) as a surgical intervention .  The patient's history has been reviewed, patient examined, no change in status, stable for surgery.  I have reviewed the patient's chart and labs.  Questions were answered to the patient's satisfaction.     Vickki Muff, Amy M

## 2015-02-16 NOTE — Transfer of Care (Signed)
Immediate Anesthesia Transfer of Care Note  Patient: Jacqueline Warren  Procedure(s) Performed: Procedure(s) with comments: BLEPHAROPLASTY UPPER EYELID W/EXCESS SKIN (Bilateral) - DIABETIC - oral meds PTOSIS REPAIR (Bilateral)  Patient Location: PACU  Anesthesia Type: MAC  Level of Consciousness: awake, alert  and patient cooperative  Airway and Oxygen Therapy: Patient Spontanous Breathing and Patient connected to supplemental oxygen  Post-op Assessment: Post-op Vital signs reviewed, Patient's Cardiovascular Status Stable, Respiratory Function Stable, Patent Airway and No signs of Nausea or vomiting  Post-op Vital Signs: Reviewed and stable  Complications: No apparent anesthesia complications

## 2015-02-16 NOTE — Anesthesia Preprocedure Evaluation (Signed)
Anesthesia Evaluation  Patient identified by MRN, date of birth, ID band  Reviewed: NPO status   History of Anesthesia Complications (+) PONV and history of anesthetic complications  Airway Mallampati: II  TM Distance: >3 FB Neck ROM: full    Dental no notable dental hx.    Pulmonary neg pulmonary ROS, former smoker,    Pulmonary exam normal       Cardiovascular Exercise Tolerance: Good hypertension, Normal cardiovascular exam Ekg : Normal sinus rhythm Nonspecific ST abnormality Abnormal ECG ST now depressed in Anterior leads;  Cards clearance: 9/2: Dr. Laurelyn Sickle;  Lipids;   Neuro/Psych Anxiety Neuropathy;  Chronic pain > feet; negative psych ROS   GI/Hepatic Neg liver ROS, GERD-  Controlled,  Endo/Other  diabetes  Renal/GU negative Renal ROS  negative genitourinary   Musculoskeletal  (+) Arthritis -,   Abdominal   Peds  Hematology Breast Cancer > lumpectomy > last chemo 2011;  Lymphedema on R arm.  IV ok on Left arm;    Anesthesia Other Findings   Reproductive/Obstetrics negative OB ROS                             Anesthesia Physical Anesthesia Plan  ASA: III  Anesthesia Plan: MAC   Post-op Pain Management:    Induction:   Airway Management Planned:   Additional Equipment:   Intra-op Plan:   Post-operative Plan:   Informed Consent: I have reviewed the patients History and Physical, chart, labs and discussed the procedure including the risks, benefits and alternatives for the proposed anesthesia with the patient or authorized representative who has indicated his/her understanding and acceptance.     Plan Discussed with: CRNA  Anesthesia Plan Comments:         Anesthesia Quick Evaluation

## 2015-02-16 NOTE — Anesthesia Postprocedure Evaluation (Signed)
  Anesthesia Post-op Note  Patient: Jacqueline Warren  Procedure(s) Performed: Procedure(s) with comments: BLEPHAROPLASTY UPPER EYELID W/EXCESS SKIN (Bilateral) - DIABETIC - oral meds PTOSIS REPAIR (Bilateral)  Anesthesia type:MAC  Patient location: PACU  Post pain: Pain level controlled  Post assessment: Post-op Vital signs reviewed, Patient's Cardiovascular Status Stable, Respiratory Function Stable, Patent Airway and No signs of Nausea or vomiting  Post vital signs: Reviewed and stable  Last Vitals:  Filed Vitals:   02/16/15 0915  BP: 138/93  Pulse: 74  Temp:   Resp: 17    Level of consciousness: awake, alert  and patient cooperative  Complications: No apparent anesthesia complications

## 2015-02-17 ENCOUNTER — Encounter: Payer: Self-pay | Admitting: Ophthalmology

## 2015-10-13 ENCOUNTER — Encounter: Payer: Self-pay | Admitting: *Deleted

## 2015-10-14 ENCOUNTER — Ambulatory Visit
Admission: RE | Admit: 2015-10-14 | Discharge: 2015-10-14 | Disposition: A | Discharge status: Home / Self Care | Payer: Medicare Other | Source: Ambulatory Visit | Attending: Unknown Physician Specialty | Admitting: Unknown Physician Specialty

## 2015-10-14 ENCOUNTER — Ambulatory Visit: Payer: Medicare Other | Admitting: Anesthesiology

## 2015-10-14 ENCOUNTER — Encounter: Payer: Self-pay | Admitting: *Deleted

## 2015-10-14 ENCOUNTER — Encounter
Admission: RE | Disposition: A | Discharge status: Home / Self Care | Payer: Self-pay | Source: Ambulatory Visit | Attending: Unknown Physician Specialty

## 2015-10-14 DIAGNOSIS — Z1211 Encounter for screening for malignant neoplasm of colon: Secondary | ICD-10-CM | POA: Insufficient documentation

## 2015-10-14 DIAGNOSIS — M1991 Primary osteoarthritis, unspecified site: Secondary | ICD-10-CM | POA: Diagnosis not present

## 2015-10-14 DIAGNOSIS — F419 Anxiety disorder, unspecified: Secondary | ICD-10-CM | POA: Diagnosis not present

## 2015-10-14 DIAGNOSIS — E7800 Pure hypercholesterolemia, unspecified: Secondary | ICD-10-CM | POA: Diagnosis not present

## 2015-10-14 DIAGNOSIS — K219 Gastro-esophageal reflux disease without esophagitis: Secondary | ICD-10-CM | POA: Diagnosis not present

## 2015-10-14 DIAGNOSIS — K648 Other hemorrhoids: Secondary | ICD-10-CM | POA: Insufficient documentation

## 2015-10-14 DIAGNOSIS — D122 Benign neoplasm of ascending colon: Secondary | ICD-10-CM | POA: Diagnosis not present

## 2015-10-14 DIAGNOSIS — I10 Essential (primary) hypertension: Secondary | ICD-10-CM | POA: Diagnosis not present

## 2015-10-14 DIAGNOSIS — E1142 Type 2 diabetes mellitus with diabetic polyneuropathy: Secondary | ICD-10-CM | POA: Diagnosis not present

## 2015-10-14 DIAGNOSIS — E669 Obesity, unspecified: Secondary | ICD-10-CM | POA: Diagnosis not present

## 2015-10-14 DIAGNOSIS — Z87891 Personal history of nicotine dependence: Secondary | ICD-10-CM | POA: Insufficient documentation

## 2015-10-14 DIAGNOSIS — Z6836 Body mass index (BMI) 36.0-36.9, adult: Secondary | ICD-10-CM | POA: Insufficient documentation

## 2015-10-14 DIAGNOSIS — K640 First degree hemorrhoids: Secondary | ICD-10-CM | POA: Diagnosis not present

## 2015-10-14 DIAGNOSIS — K5730 Diverticulosis of large intestine without perforation or abscess without bleeding: Secondary | ICD-10-CM | POA: Insufficient documentation

## 2015-10-14 DIAGNOSIS — Z853 Personal history of malignant neoplasm of breast: Secondary | ICD-10-CM | POA: Diagnosis not present

## 2015-10-14 DIAGNOSIS — Z86010 Personal history of colonic polyps: Secondary | ICD-10-CM | POA: Diagnosis not present

## 2015-10-14 DIAGNOSIS — Z85820 Personal history of malignant melanoma of skin: Secondary | ICD-10-CM | POA: Diagnosis not present

## 2015-10-14 HISTORY — DX: Cyst of pancreas: K86.2

## 2015-10-14 HISTORY — PX: COLONOSCOPY WITH PROPOFOL: SHX5780

## 2015-10-14 HISTORY — DX: Personal history of other medical treatment: Z92.89

## 2015-10-14 HISTORY — DX: Other specified disorders of adrenal gland: E27.8

## 2015-10-14 LAB — GLUCOSE, CAPILLARY: Glucose-Capillary: 111 mg/dL — ABNORMAL HIGH (ref 65–99)

## 2015-10-14 SURGERY — COLONOSCOPY WITH PROPOFOL
Anesthesia: General

## 2015-10-14 MED ORDER — SODIUM CHLORIDE 0.9 % IV SOLN
INTRAVENOUS | Status: DC
  Administered 2015-10-14: 10:00:00 via INTRAVENOUS

## 2015-10-14 MED ORDER — FENTANYL CITRATE (PF) 100 MCG/2ML IJ SOLN
INTRAMUSCULAR | Status: DC | PRN
  Administered 2015-10-14: 50 ug via INTRAVENOUS

## 2015-10-14 MED ORDER — MIDAZOLAM HCL 2 MG/2ML IJ SOLN
INTRAMUSCULAR | Status: DC | PRN
  Administered 2015-10-14: 1 mg via INTRAVENOUS

## 2015-10-14 MED ORDER — SODIUM CHLORIDE 0.9 % IV SOLN: INTRAVENOUS | Status: DC

## 2015-10-14 MED ORDER — PROPOFOL 500 MG/50ML IV EMUL
INTRAVENOUS | Status: DC | PRN
  Administered 2015-10-14: 120 ug/kg/min via INTRAVENOUS

## 2015-10-14 NOTE — H&P (Signed)
Primary Care Physician:  Gayland Curry, MD Primary Gastroenterologist:  Dr. Vira Agar  Pre-Procedure History & Physical: HPI:  Jacqueline Warren is a 65 y.o. female is here for an colonoscopy.   Past Medical History  Diagnosis Date  . Hypertension   . Diabetes mellitus without complication ([place])   . Hypercholesteremia   . 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 Arthritis     fingers  . Seasonal allergies   . GERD (gastroesophageal reflux disease)   . Motion sickness     boats, planes  . Hiatal hernia   . Kidney stones   . Anxiety   . Lymphedema     mild - both arms  . History of blood transfusion   . 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 Pancreatic cyst   . Breast CA (HCC)     bilateral  . Skin cancer     melanoma of back; nose    Past Surgical History  Procedure Laterality Date  . Abdominal hysterectomy    . Tubal ligation    . Breast lumpectomy Bilateral   . Insertion central venous access device w/ subcutaneous port      port-a-cath; now removed  . Mohs surgery      nose  . Skin cancer excision      back  . Brow lift Bilateral 02/16/2015    Procedure: BLEPHAROPLASTY UPPER EYELID W/EXCESS SKIN;  Surgeon: Karle Starch, MD;  Location: [facility];  Service: Ophthalmology;  Laterality: Bilateral;  DIABETIC - oral meds  . Ptosis repair Bilateral 02/16/2015    Procedure: PTOSIS REPAIR;  Surgeon: Karle Starch, MD;  Location: [facility];  Service: Ophthalmology;  Laterality: Bilateral;  . Tonsillectomy    . Colonoscopy    . Mastectomy Left     partial    Prior to Admission medications   Medication Sig Start Date End Date Taking? Authorizing Provider  carvedilol (COREG) 25 MG tablet Take 25 mg by mouth 2 (two) times daily with a meal.   Yes Historical Provider, MD  Magnesium Oxide (MAG-OX PO) Take by mouth.   Yes Historical Provider, MD  rosuvastatin (CRESTOR) 40 MG tablet Take 40 mg by mouth daily.   Yes Historical Provider, MD   acetaminophen (TYLENOL) 325 MG tablet Take 650 mg by mouth every 6 (six) hours as needed.    Historical Provider, MD  aspirin 81 MG tablet Take 81 mg by mouth daily.    Historical Provider, MD  atorvastatin (LIPITOR) 40 MG tablet Take 40 mg by mouth daily. PM    Historical Provider, MD  bacitracin ophthalmic ointment Use on sutures 4 times a day for 12-14 days 02/16/15   Karle Starch, MD  Cholecalciferol (VITAMIN D3 PO) Take by mouth.    Historical Provider, MD  docusate sodium (COLACE) 50 MG capsule Take 50 mg by mouth 2 (two) times daily.    Historical Provider, MD  DULoxetine (CYMBALTA) 30 MG capsule Take 30 mg by mouth 2 (two) times daily.    Historical Provider, MD  esomeprazole (NEXIUM) 40 MG capsule Take 40 mg by mouth daily at 12 noon. AM    Historical Provider, MD  gabapentin (NEURONTIN) 300 MG capsule Take 900 mg by mouth 3 (three) times daily.    Historical Provider, MD  ibuprofen (ADVIL,MOTRIN) 200 MG tablet Take 200 mg by mouth every 6 (six) hours as needed.    Historical Provider, MD  Loratadine 10 MG  CAPS Take by mouth. AM    Historical Provider, MD  metFORMIN (GLUCOPHAGE) 1000 MG tablet Take 1,000 mg by mouth 2 (two) times daily with a meal.    Historical Provider, MD  morphine (MS CONTIN) 15 MG 12 hr tablet Take 15 mg by mouth every 12 (twelve) hours.    Historical Provider, MD  Multiple Vitamins-Minerals (MULTIVITAMIN PO) Take by mouth. AM    Historical Provider, MD  oxycodone (OXY-IR) 5 MG capsule Take 5 mg by mouth 2 (two) times daily. Midday and suppertime    Historical Provider, MD  oxyCODONE-acetaminophen (PERCOCET) 5-325 MG per tablet Take 1 tablet by mouth every 4 (four) hours as needed for severe pain. 02/16/15   Amy Dennie Maizes, MD  potassium chloride SA (K-DUR,KLOR-CON) 20 MEQ tablet Take 20 mEq by mouth daily. AM    Historical Provider, MD  Pyridoxine HCl (VITAMIN B-6 PO) Take by mouth. AM    Historical Provider, MD  triamterene-hydrochlorothiazide (DYAZIDE) 37.5-25 MG per  capsule Take 1 capsule by mouth daily. AM    Historical Provider, MD  valsartan (DIOVAN) 80 MG tablet Take 80 mg by mouth daily. AM    Historical Provider, MD    Allergies as of 09/20/2015 - Review Complete 02/16/2015  Allergen Reaction Noted  . Adhesive [tape] Other (See Comments) 02/08/2015  . Clindamycin/lincomycin Nausea And Vomiting 02/08/2015    History reviewed. No pertinent family history.  Social History   Social History  . Marital Status: Divorced    Spouse Name: N/A  . Number of Children: N/A  . Years of Education: N/A   Occupational History  . Not on file.   Social History Main Topics  . Smoking status: Former Smoker    Quit date: 06/12/1968  . Smokeless tobacco: Not on file  . Alcohol Use: No  . Drug Use: No  . Sexual Activity: Not on file   Other Topics Concern  . Not on file   Social History Narrative    Review of Systems: See HPI, otherwise negative ROS  Physical Exam: BP 139/70 mmHg  Pulse 69  Temp(Src) 96.6 F (35.9 C) (Tympanic)  Resp 18  Ht 5' 5.5" (1.664 m)  Wt 100.699 kg (222 lb)  BMI 36.37 kg/m2  SpO2 97% General:   Alert,  pleasant and cooperative in NAD Head:  Normocephalic and atraumatic. Neck:  Supple; no masses or thyromegaly. Lungs:  Clear throughout to auscultation.    Heart:  Regular rate and rhythm. Abdomen:  Soft, nontender and nondistended. Normal bowel sounds, without guarding, and without rebound.   Neurologic:  Alert and  oriented x4;  grossly normal neurologically.  Impression/Plan: Jacqueline Warren is here for an colonoscopy to be performed for [facility] colon polyps  Risks, benefits, limitations, and alternatives regarding  colonoscopy have been reviewed with the patient.  Questions have been answered.  All parties agreeable.   Gaylyn Cheers, MD  10/14/2015, 9:31 AM

## 2015-10-14 NOTE — Anesthesia Postprocedure Evaluation (Signed)
Anesthesia Post Note  Patient: Jacqueline Warren  Procedure(s) Performed: Procedure(s) (LRB): COLONOSCOPY WITH PROPOFOL (N/A)  Patient location during evaluation: PACU Anesthesia Type: General Level of consciousness: awake Pain management: satisfactory to patient Vital Signs Assessment: post-procedure vital signs reviewed and stable Respiratory status: spontaneous breathing Cardiovascular status: stable Anesthetic complications: no    Last Vitals:  Filed Vitals:   10/14/15 0910 10/14/15 1005  BP: 139/70 99/59  Pulse: 69 71  Temp: 35.9 C 35.8 C  Resp: 18 13    Last Pain: There were no vitals filed for this visit.               VAN STAVEREN,Laster Appling

## 2015-10-14 NOTE — Anesthesia Procedure Notes (Signed)
Performed by: COOK-MARTIN, Jordynn Perrier Pre-anesthesia Checklist: Patient identified, Suction available, Emergency Drugs available, Patient being monitored and Timeout performed Patient Re-evaluated:Patient Re-evaluated prior to inductionOxygen Delivery Method: Nasal cannula Preoxygenation: Pre-oxygenation with 100% oxygen Intubation Type: IV induction Placement Confirmation: positive ETCO2 and CO2 detector       

## 2015-10-14 NOTE — Op Note (Signed)
[facility] Gastroenterology Patient Name: Jacqueline Warren Procedure Date: 10/14/2015 9:35 AM MRN: [id number] Account #: [id number] Date of Birth: 1951/04/17 Admit Type: Outpatient Age: 65 Room: [facility] ENDO ROOM 1 Gender: Female Note Status: Finalized Procedure:            Colonoscopy Indications:          High risk colon cancer surveillance: Personal history                        of colonic polyps Providers:            Manya Silvas, MD Referring MD:         Gayland Curry, MD (Referring MD) Medicines:            Propofol per Anesthesia Complications:        No immediate complications. Procedure:            Pre-Anesthesia Assessment:                       - After reviewing the risks and benefits, the patient                        was deemed in satisfactory condition to undergo the                        procedure.                       After obtaining informed consent, the colonoscope was                        passed under direct vision. Throughout the procedure,                        the patient's blood pressure, pulse, and oxygen                        saturations were monitored continuously. The                        Colonoscope was introduced through the anus and                        advanced to the the cecum, identified by appendiceal                        orifice and ileocecal valve. The colonoscopy was                        performed without difficulty. The patient tolerated the                        procedure well. The quality of the bowel preparation                        was good. Findings:      A medium polyp was found in the ascending colon. The polyp was sessile.       The polyp was removed with a hot snare. Resection and retrieval were       complete. This was cut so I could suction it through the channel.  Many small-mouthed diverticula were found in the sigmoid colon and       descending colon.      Internal hemorrhoids were  found during endoscopy. The hemorrhoids were       small and Grade I (internal hemorrhoids that do not prolapse). Impression:           - One medium polyp in the ascending colon, removed with                        a hot snare. Resected and retrieved.                       - Diverticulosis in the sigmoid colon and in the                        descending colon.                       - Internal hemorrhoids. Recommendation:       - Await pathology results. Manya Silvas, MD 10/14/2015 10:05:30 AM This report has been signed electronically. Number of Addenda: 0 Note Initiated On: 10/14/2015 9:35 AM Scope Withdrawal Time: 0 hours 11 minutes 12 seconds  Total Procedure Duration: 0 hours 21 minutes 39 seconds       [facility]

## 2015-10-14 NOTE — Anesthesia Preprocedure Evaluation (Signed)
Anesthesia Evaluation  Patient identified by MRN, date of birth, ID band Patient awake    Reviewed: Allergy & Precautions, NPO status , Patient's Chart, lab work & pertinent test results  Airway Mallampati: III       Dental  (+) Teeth Intact   Pulmonary former smoker,    breath sounds clear to auscultation       Cardiovascular Exercise Tolerance: Good hypertension, Pt. on medications  Rhythm:Regular Rate:Normal     Neuro/Psych Anxiety    GI/Hepatic Neg liver ROS, hiatal hernia, GERD  Medicated,  Endo/Other  diabetes, Well Controlled, Type 2, Oral Hypoglycemic Agents  Renal/GU      Musculoskeletal   Abdominal (+) + obese,   Peds  Hematology   Anesthesia Other Findings   Reproductive/Obstetrics                             Anesthesia Physical Anesthesia Plan  ASA: III  Anesthesia Plan: General   Post-op Pain Management:    Induction: Intravenous  Airway Management Planned: Natural Airway and Nasal Cannula  Additional Equipment:   Intra-op Plan:   Post-operative Plan:   Informed Consent: I have reviewed the patients History and Physical, chart, labs and discussed the procedure including the risks, benefits and alternatives for the proposed anesthesia with the patient or authorized representative who has indicated his/her understanding and acceptance.     Plan Discussed with: CRNA  Anesthesia Plan Comments:         Anesthesia Quick Evaluation

## 2015-10-14 NOTE — Transfer of Care (Signed)
Immediate Anesthesia Transfer of Care Note  Patient: Jacqueline Warren  Procedure(s) Performed: Procedure(s): COLONOSCOPY WITH PROPOFOL (N/A)  Patient Location: PACU  Anesthesia Type:General  Level of Consciousness: awake and sedated  Airway & Oxygen Therapy: Patient Spontanous Breathing and Patient connected to nasal cannula oxygen  Post-op Assessment: Report given to RN and Post -op Vital signs reviewed and stable  Post vital signs: Reviewed and stable  Last Vitals:  Filed Vitals:   10/14/15 0910  BP: 139/70  Pulse: 69  Temp: 35.9 C  Resp: 18    Last Pain: There were no vitals filed for this visit.       Complications: No apparent anesthesia complications

## 2015-10-15 ENCOUNTER — Encounter: Payer: Self-pay | Admitting: Unknown Physician Specialty

## 2015-10-15 LAB — SURGICAL PATHOLOGY

## 2019-03-31 ENCOUNTER — Other Ambulatory Visit: Admission: RE | Admit: 2019-03-31 | Payer: Medicare Other | Source: Ambulatory Visit

## 2019-04-03 ENCOUNTER — Encounter: Admission: RE | Payer: Self-pay | Source: Home / Self Care

## 2019-04-03 ENCOUNTER — Ambulatory Visit: Admission: RE | Admit: 2019-04-03 | Payer: Medicare Other | Source: Home / Self Care | Admitting: Internal Medicine

## 2019-04-03 SURGERY — COLONOSCOPY WITH PROPOFOL
Anesthesia: General

## 2019-07-24 ENCOUNTER — Ambulatory Visit: Payer: Medicare Other | Attending: Internal Medicine

## 2019-07-24 DIAGNOSIS — Z23 Encounter for immunization: Secondary | ICD-10-CM | POA: Insufficient documentation

## 2019-07-24 NOTE — Progress Notes (Signed)
   Covid-19 Vaccination Clinic  Name:  ARDYN EPLER    MRN: [id number] DOB: 07-03-1950  07/24/2019  Ms. Hassinger was observed post Covid-19 immunization for 15 minutes without incidence. She was provided with Vaccine Information Sheet and instruction to access the V-Safe system.   Ms. Fistler was instructed to call 911 with any severe reactions post vaccine: Marland Kitchen Difficulty breathing  . Swelling of your face and throat  . A fast heartbeat  . A bad rash all over your body  . Dizziness and weakness    Immunizations Administered    Name Date Dose VIS Date Route   Pfizer COVID-19 Vaccine 07/24/2019 10:42 AM 0.3 mL 05/23/2019 Intramuscular   Manufacturer: [place]   Lot: [id number]   [place]: [id number]

## 2019-08-20 ENCOUNTER — Ambulatory Visit: Payer: Medicare Other | Attending: Internal Medicine

## 2019-08-20 DIAGNOSIS — Z23 Encounter for immunization: Secondary | ICD-10-CM

## 2019-08-20 NOTE — Progress Notes (Signed)
   Covid-19 Vaccination Clinic  Name:  Jacqueline Warren    MRN: [id number] DOB: 1950-07-15  08/20/2019  [name] was observed post Covid-19 immunization for 15 minutes without incident. She was provided with Vaccine Information Sheet and instruction to access the V-Safe system.   [name] was instructed to call 911 with any severe reactions post vaccine: Marland Kitchen Difficulty breathing  . Swelling of face and throat  . A fast heartbeat  . A bad rash all over body  . Dizziness and weakness   Immunizations Administered    Name Date Dose VIS Date Route   Pfizer COVID-19 Vaccine 08/20/2019  4:33 PM 0.3 mL 05/23/2019 Intramuscular   Manufacturer: [place]   Lot: [id number]   [place]: [id number]

## 2019-11-03 ENCOUNTER — Other Ambulatory Visit
Admission: RE | Admit: 2019-11-03 | Discharge: 2019-11-03 | Disposition: A | Discharge status: Home / Self Care | Payer: Medicare Other | Source: Ambulatory Visit | Attending: Internal Medicine | Admitting: Internal Medicine

## 2019-11-03 ENCOUNTER — Other Ambulatory Visit: Payer: Self-pay

## 2019-11-03 DIAGNOSIS — Z01812 Encounter for preprocedural laboratory examination: Secondary | ICD-10-CM | POA: Diagnosis present

## 2019-11-03 DIAGNOSIS — Z20822 Contact with and (suspected) exposure to covid-19: Secondary | ICD-10-CM | POA: Insufficient documentation

## 2019-11-03 LAB — SARS CORONAVIRUS 2 (TAT 6-24 HRS): SARS Coronavirus 2: NEGATIVE

## 2019-11-04 ENCOUNTER — Encounter: Payer: Self-pay | Admitting: Internal Medicine

## 2019-11-05 ENCOUNTER — Ambulatory Visit: Payer: Medicare Other | Admitting: Anesthesiology

## 2019-11-05 ENCOUNTER — Ambulatory Visit
Admission: RE | Admit: 2019-11-05 | Discharge: 2019-11-05 | Disposition: A | Discharge status: Home / Self Care | Payer: Medicare Other | Attending: Internal Medicine | Admitting: Internal Medicine

## 2019-11-05 ENCOUNTER — Encounter: Payer: Self-pay | Admitting: Internal Medicine

## 2019-11-05 ENCOUNTER — Encounter
Admission: RE | Disposition: A | Discharge status: Home / Self Care | Payer: Self-pay | Source: Home / Self Care | Attending: Internal Medicine

## 2019-11-05 ENCOUNTER — Other Ambulatory Visit: Payer: Self-pay

## 2019-11-05 DIAGNOSIS — K589 Irritable bowel syndrome without diarrhea: Secondary | ICD-10-CM | POA: Insufficient documentation

## 2019-11-05 DIAGNOSIS — K219 Gastro-esophageal reflux disease without esophagitis: Secondary | ICD-10-CM | POA: Insufficient documentation

## 2019-11-05 DIAGNOSIS — Z1211 Encounter for screening for malignant neoplasm of colon: Secondary | ICD-10-CM | POA: Insufficient documentation

## 2019-11-05 DIAGNOSIS — Z881 Allergy status to other antibiotic agents status: Secondary | ICD-10-CM | POA: Diagnosis not present

## 2019-11-05 DIAGNOSIS — K6389 Other specified diseases of intestine: Secondary | ICD-10-CM | POA: Insufficient documentation

## 2019-11-05 DIAGNOSIS — I10 Essential (primary) hypertension: Secondary | ICD-10-CM | POA: Diagnosis not present

## 2019-11-05 DIAGNOSIS — Z7984 Long term (current) use of oral hypoglycemic drugs: Secondary | ICD-10-CM | POA: Insufficient documentation

## 2019-11-05 DIAGNOSIS — K5730 Diverticulosis of large intestine without perforation or abscess without bleeding: Secondary | ICD-10-CM | POA: Insufficient documentation

## 2019-11-05 DIAGNOSIS — D120 Benign neoplasm of cecum: Secondary | ICD-10-CM | POA: Insufficient documentation

## 2019-11-05 DIAGNOSIS — E1142 Type 2 diabetes mellitus with diabetic polyneuropathy: Secondary | ICD-10-CM | POA: Insufficient documentation

## 2019-11-05 DIAGNOSIS — Z79899 Other long term (current) drug therapy: Secondary | ICD-10-CM | POA: Diagnosis not present

## 2019-11-05 DIAGNOSIS — M19049 Primary osteoarthritis, unspecified hand: Secondary | ICD-10-CM | POA: Insufficient documentation

## 2019-11-05 DIAGNOSIS — E7800 Pure hypercholesterolemia, unspecified: Secondary | ICD-10-CM | POA: Insufficient documentation

## 2019-11-05 DIAGNOSIS — D122 Benign neoplasm of ascending colon: Secondary | ICD-10-CM | POA: Insufficient documentation

## 2019-11-05 DIAGNOSIS — Z86010 Personal history of colonic polyps: Secondary | ICD-10-CM | POA: Diagnosis present

## 2019-11-05 DIAGNOSIS — Z85820 Personal history of malignant melanoma of skin: Secondary | ICD-10-CM | POA: Insufficient documentation

## 2019-11-05 DIAGNOSIS — Z87891 Personal history of nicotine dependence: Secondary | ICD-10-CM | POA: Diagnosis not present

## 2019-11-05 DIAGNOSIS — Z888 Allergy status to other drugs, medicaments and biological substances status: Secondary | ICD-10-CM | POA: Insufficient documentation

## 2019-11-05 DIAGNOSIS — F419 Anxiety disorder, unspecified: Secondary | ICD-10-CM | POA: Diagnosis not present

## 2019-11-05 DIAGNOSIS — Z853 Personal history of malignant neoplasm of breast: Secondary | ICD-10-CM | POA: Diagnosis not present

## 2019-11-05 DIAGNOSIS — Z7982 Long term (current) use of aspirin: Secondary | ICD-10-CM | POA: Diagnosis not present

## 2019-11-05 HISTORY — DX: Irritable bowel syndrome, unspecified: K58.9

## 2019-11-05 HISTORY — PX: COLONOSCOPY WITH PROPOFOL: SHX5780

## 2019-11-05 HISTORY — DX: Nausea with vomiting, unspecified: R11.2

## 2019-11-05 HISTORY — DX: Benign neoplasm of colon, unspecified: D12.6

## 2019-11-05 HISTORY — DX: Other specified disorders of adrenal gland: E27.8

## 2019-11-05 LAB — GLUCOSE, CAPILLARY: Glucose-Capillary: 149 mg/dL — ABNORMAL HIGH (ref 70–99)

## 2019-11-05 SURGERY — COLONOSCOPY WITH PROPOFOL
Anesthesia: General

## 2019-11-05 MED ORDER — PROPOFOL 10 MG/ML IV BOLUS
INTRAVENOUS | Status: AC
  Filled 2019-11-05: qty 100

## 2019-11-05 MED ORDER — SODIUM CHLORIDE 0.9 % IV SOLN: INTRAVENOUS | Status: DC

## 2019-11-05 MED ORDER — LIDOCAINE HCL (PF) 2 % IJ SOLN
INTRAMUSCULAR | Status: AC
  Filled 2019-11-05: qty 10

## 2019-11-05 MED ORDER — PROPOFOL 10 MG/ML IV BOLUS
INTRAVENOUS | Status: DC | PRN
  Administered 2019-11-05: 50 mg via INTRAVENOUS

## 2019-11-05 MED ORDER — LIDOCAINE HCL (CARDIAC) PF 100 MG/5ML IV SOSY
PREFILLED_SYRINGE | INTRAVENOUS | Status: DC | PRN
  Administered 2019-11-05: 100 mg via INTRAVENOUS

## 2019-11-05 MED ORDER — PROPOFOL 500 MG/50ML IV EMUL
INTRAVENOUS | Status: DC | PRN
  Administered 2019-11-05: 100 ug/kg/min via INTRAVENOUS

## 2019-11-05 NOTE — Op Note (Signed)
[facility] Gastroenterology Patient Name: Jacqueline Warren Procedure Date: 11/05/2019 9:21 AM MRN: [id number] Account #: [id number] Date of Birth: 03-Jan-1951 Admit Type: Outpatient Age: 69 Room: [facility] ENDO ROOM 3 Gender: Female Note Status: Finalized Procedure:             Colonoscopy Indications:           Surveillance: Personal history of adenomatous polyps                         on last colonoscopy 5 years ago Providers:             Lorie Apley K. Lenward Able MD, MD Medicines:             Propofol per Anesthesia Complications:         No immediate complications. Procedure:             Pre-Anesthesia Assessment:                        - The risks and benefits of the procedure and the                         sedation options and risks were discussed with the                         patient. All questions were answered and informed                         consent was obtained.                        - Patient identification and proposed procedure were                         verified prior to the procedure by the nurse. The                         procedure was verified in the procedure room.                        - ASA Grade Assessment: III - A patient with severe                         systemic disease.                        - After reviewing the risks and benefits, the patient                         was deemed in satisfactory condition to undergo the                         procedure.                        After obtaining informed consent, the colonoscope was                         passed under direct vision. Throughout the procedure,  the patient's blood pressure, pulse, and oxygen                         saturations were monitored continuously. The                         Colonoscope was introduced through the anus and                         advanced to the the cecum, identified by appendiceal                         orifice and ileocecal  valve. The colonoscopy was                         performed without difficulty. The patient tolerated                         the procedure well. The quality of the bowel                         preparation was adequate. The ileocecal valve,                         appendiceal orifice, and rectum were photographed. Findings:      The perianal and digital rectal examinations were normal. Pertinent       negatives include normal sphincter tone and no palpable rectal lesions.      A few medium-mouthed diverticula were found in the sigmoid colon.      A 5 mm polyp was found in the ileocecal valve. The polyp was sessile.       The polyp was removed with a jumbo cold forceps. Resection and retrieval       were complete.      A 10 mm polyp was found in the proximal ascending colon. The polyp was       semi-pedunculated. The polyp was removed with a cold snare. Resection       and retrieval were complete.      The exam was otherwise without abnormality on direct and retroflexion       views.      A diffuse area of mild melanosis was found in the cecum. Biopsies were       taken with a cold forceps for histology. Impression:            - Diverticulosis in the sigmoid colon.                        - One 5 mm polyp at the ileocecal valve, removed with                         a jumbo cold forceps. Resected and retrieved.                        - One 10 mm polyp in the proximal ascending colon,                         removed with a cold snare. Resected and retrieved.                        -  The examination was otherwise normal on direct and                         retroflexion views. Recommendation:        - Patient has a contact number available for                         emergencies. The signs and symptoms of potential                         delayed complications were discussed with the patient.                         Return to normal activities tomorrow. Written                          discharge instructions were provided to the patient.                        - Resume previous diet.                        - Continue present medications.                        - Repeat colonoscopy is recommended for surveillance.                         The colonoscopy date will be determined after                         pathology results from today's exam become available                         for review.                        - Return to GI office PRN.                        - The findings and recommendations were discussed with                         the patient. Procedure Code(s):     --- Professional ---                        [phone], Colonoscopy, flexible; with removal of                         tumor(s), polyp(s), or other lesion(s) by snare                         technique                        [id number], 79, Colonoscopy, flexible; with biopsy, single                         or multiple Diagnosis Code(s):     --- Professional ---  K57.30, Diverticulosis of large intestine without                         perforation or abscess without bleeding                        K63.5, Polyp of colon                        Z86.010, Personal history of colonic polyps CPT copyright 2019 American Medical Association. All rights reserved. The codes documented in this report are preliminary and upon coder review may  be revised to meet current compliance requirements. Efrain Sella MD, MD 11/05/2019 9:52:14 AM This report has been signed electronically. Number of Addenda: 0 Note Initiated On: 11/05/2019 9:21 AM Scope Withdrawal Time: 0 hours 5 minutes 48 seconds  Total Procedure Duration: 0 hours 8 minutes 49 seconds  Estimated Blood Loss:  Estimated blood loss: none. Estimated blood loss: none.      [facility]

## 2019-11-05 NOTE — Anesthesia Preprocedure Evaluation (Signed)
Anesthesia Evaluation  Patient identified by MRN, date of birth, ID band Patient awake    Reviewed: Allergy & Precautions, NPO status , Patient's Chart, lab work & pertinent test results  History of Anesthesia Complications Negative for: history of anesthetic complications  Airway Mallampati: III  TM Distance: >3 FB Neck ROM: Full    Dental  (+) Implants   Pulmonary neg sleep apnea, neg COPD, former smoker,    breath sounds clear to auscultation- rhonchi (-) wheezing      Cardiovascular hypertension, Pt. on medications (-) CAD, (-) Past MI, (-) Cardiac Stents and (-) CABG  Rhythm:Regular Rate:Normal - Systolic murmurs and - Diastolic murmurs    Neuro/Psych neg Seizures Anxiety negative neurological ROS     GI/Hepatic Neg liver ROS, hiatal hernia, GERD  ,  Endo/Other  diabetes (diet controlled)  Renal/GU Renal disease: hx of nephrolithiasis.     Musculoskeletal  (+) Arthritis ,   Abdominal (+) + obese,   Peds  Hematology negative hematology ROS (+)   Anesthesia Other Findings Past Medical History: No date: Adrenal mass (HCC) No date: Anxiety No date: Arthritis     Comment:  fingers No date: Breast CA (HCC)     Comment:  bilateral No date: Colon adenomas No date: Diabetes mellitus without complication (HCC) No date: GERD (gastroesophageal reflux disease) No date: Hiatal hernia No date: History of blood transfusion No date: Hypercholesteremia No date: Hypertension No date: IBS (irritable bowel syndrome) No date: Kidney stones No date: Lymphedema     Comment:  mild - both arms No date: Mass of adrenal gland ([place]) No date: Motion sickness     Comment:  boats, planes No date: Pancreatic cyst No date: Pancreatic cyst No date: Peripheral neuropathy     Comment:  feet, secondary to DM No date: Peripheral neuropathy No date: Seasonal allergies No date: Skin cancer     Comment:  melanoma of back; nose   Reproductive/Obstetrics                             Anesthesia Physical Anesthesia Plan  ASA: II  Anesthesia Plan: General   Post-op Pain Management:    Induction: Intravenous  PONV Risk Score and Plan: 2 and Propofol infusion  Airway Management Planned: Natural Airway  Additional Equipment:   Intra-op Plan:   Post-operative Plan:   Informed Consent: I have reviewed the patients History and Physical, chart, labs and discussed the procedure including the risks, benefits and alternatives for the proposed anesthesia with the patient or authorized representative who has indicated his/her understanding and acceptance.     Dental advisory given  Plan Discussed with: CRNA and Anesthesiologist  Anesthesia Plan Comments:         Anesthesia Quick Evaluation

## 2019-11-05 NOTE — Anesthesia Postprocedure Evaluation (Signed)
Anesthesia Post Note  Patient: Jacqueline Warren  Procedure(s) Performed: COLONOSCOPY WITH PROPOFOL (N/A )  Patient location during evaluation: Endoscopy Anesthesia Type: General Level of consciousness: awake and alert and oriented Pain management: pain level controlled Vital Signs Assessment: post-procedure vital signs reviewed and stable Respiratory status: spontaneous breathing, nonlabored ventilation and respiratory function stable Cardiovascular status: blood pressure returned to baseline and stable Postop Assessment: no signs of nausea or vomiting Anesthetic complications: no     Last Vitals:  Vitals:   11/05/19 1013 11/05/19 1023  BP: 112/70 131/72  Pulse: 78 65  Resp: (!) 21 13  Temp:    SpO2: 96% 98%    Last Pain:  Vitals:   11/05/19 1023  TempSrc:   PainSc: 3                  Delynn Pursley

## 2019-11-05 NOTE — Transfer of Care (Signed)
Immediate Anesthesia Transfer of Care Note  Patient: Jacqueline Warren  Procedure(s) Performed: COLONOSCOPY WITH PROPOFOL (N/A )  Patient Location: PACU and Endoscopy Unit  Anesthesia Type:General  Level of Consciousness: awake, oriented, drowsy and patient cooperative  Airway & Oxygen Therapy: Patient Spontanous Breathing  Post-op Assessment: Report given to RN, Post -op Vital signs reviewed and stable and Patient moving all extremities  Post vital signs: Reviewed and stable  Last Vitals:  Vitals Value Taken Time  BP    Temp    Pulse 81 11/05/19 0953  Resp 15 11/05/19 0953  SpO2 98 % 11/05/19 0953  Vitals shown include unvalidated device data.  Last Pain:  Vitals:   11/05/19 0900  TempSrc: Temporal  PainSc: 3          Complications: No apparent anesthesia complications

## 2019-11-05 NOTE — H&P (Signed)
Outpatient short stay form Pre-procedure 11/05/2019 9:01 AM [place] K. Alice Reichert, M.D.  Primary Physician: Gayland Curry, M.D.  Reason for visit:  Personal hx of adenomatous colon polyp May 2017  History of present illness:                           Patient presents for colonoscopy for a personal hx of colon polyps. The patient denies abdominal pain, abnormal weight loss or rectal bleeding.      Current Facility-Administered Medications:  .  0.9 %  sodium chloride infusion, , Intravenous, Continuous, Tamim Skog, Benay Pike, MD  Medications Prior to Admission  Medication Sig Dispense Refill Last Dose  . ASCORBIC ACID CR PO Take by mouth.     . ferrous sulfate 325 (65 FE) MG tablet Take 325 mg by mouth daily with breakfast.     . gabapentin (NEURONTIN) 300 MG capsule Take 900 mg by mouth 3 (three) times daily.   11/05/2019 at 0500  . morphine (MS CONTIN) 15 MG 12 hr tablet Take 15 mg by mouth every 12 (twelve) hours.   11/05/2019 at 0500  . acetaminophen (TYLENOL) 325 MG tablet Take 650 mg by mouth every 6 (six) hours as needed.     Marland Kitchen aspirin 81 MG tablet Take 81 mg by mouth daily.   10/26/2019  . atorvastatin (LIPITOR) 40 MG tablet Take 40 mg by mouth daily. PM     . bacitracin ophthalmic ointment Use on sutures 4 times a day for 12-14 days 3.5 g 3   . carvedilol (COREG) 25 MG tablet Take 25 mg by mouth 2 (two) times daily with a meal.     . Cholecalciferol (VITAMIN D3 PO) Take by mouth.     . docusate sodium (COLACE) 50 MG capsule Take 50 mg by mouth 2 (two) times daily.     . DULoxetine (CYMBALTA) 30 MG capsule Take 30 mg by mouth 2 (two) times daily.     Marland Kitchen esomeprazole (NEXIUM) 40 MG capsule Take 40 mg by mouth daily at 12 noon. AM     . ibuprofen (ADVIL,MOTRIN) 200 MG tablet Take 200 mg by mouth every 6 (six) hours as needed.     . Loratadine 10 MG CAPS Take by mouth. AM     . Magnesium Oxide (MAG-OX PO) Take by mouth.     . metFORMIN (GLUCOPHAGE) 1000 MG tablet Take 1,000 mg by mouth 2  (two) times daily with a meal.     . Multiple Vitamins-Minerals (MULTIVITAMIN PO) Take by mouth. AM     . oxycodone (OXY-IR) 5 MG capsule Take 5 mg by mouth 2 (two) times daily. Midday and suppertime     . oxyCODONE-acetaminophen (PERCOCET) 5-325 MG per tablet Take 1 tablet by mouth every 4 (four) hours as needed for severe pain. 6 tablet 0   . potassium chloride SA (K-DUR,KLOR-CON) 20 MEQ tablet Take 20 mEq by mouth daily. AM     . Pyridoxine HCl (VITAMIN B-6 PO) Take by mouth. AM     . rosuvastatin (CRESTOR) 40 MG tablet Take 40 mg by mouth daily.     Marland Kitchen triamterene-hydrochlorothiazide (DYAZIDE) 37.5-25 MG per capsule Take 1 capsule by mouth daily. AM     . valsartan (DIOVAN) 80 MG tablet Take 80 mg by mouth daily. AM        Allergies  Allergen Reactions  . Adhesive [Tape] Other (See Comments)    TEGADERM tears skin  . Clindamycin/Lincomycin  Nausea And Vomiting     Past Medical History:  Diagnosis Date  . Adrenal mass ([place])   . Anxiety   . Arthritis    fingers  . Breast CA ([place])    bilateral  . Colon adenomas   . Diabetes mellitus without complication ([place])   . GERD (gastroesophageal reflux disease)   . Hiatal hernia   . History of blood transfusion   . Hypercholesteremia   . Hypertension   . IBS (irritable bowel syndrome)   . Kidney stones   . Lymphedema    mild - both arms  . Mass of adrenal gland ([place])   . Motion sickness    boats, planes  . Pancreatic cyst   . Pancreatic cyst   . Peripheral neuropathy    feet, secondary to DM  . Peripheral neuropathy   . Seasonal allergies   . Skin cancer    melanoma of back; nose    Review of systems:  Otherwise negative.    Physical Exam  Gen: Alert, oriented. Appears stated age.  HEENT: [place]/AT. PERRLA. Lungs: CTA, no wheezes. CV: RR nl S1, S2. Abd: soft, benign, no masses. BS+ Ext: No edema. Pulses 2+    Planned procedures: Proceed with colonoscopy. The patient understands the nature of the planned procedure,  indications, risks, alternatives and potential complications including but not limited to bleeding, infection, perforation, damage to internal organs and possible oversedation/side effects from anesthesia. The patient agrees and gives consent to proceed.  Please refer to procedure notes for findings, recommendations and patient disposition/instructions.     Chrisanna Mishra K. Alice Reichert, M.D. Gastroenterology 11/05/2019  9:01 AM

## 2019-11-05 NOTE — Interval H&P Note (Signed)
History and Physical Interval Note:  11/05/2019 9:02 AM  Jacqueline Warren  has presented today for surgery, with the diagnosis of PERS HX OF COLON POLYPS.  The various methods of treatment have been discussed with the patient and family. After consideration of risks, benefits and other options for treatment, the patient has consented to  Procedure(s): COLONOSCOPY WITH PROPOFOL (N/A) as a surgical intervention.  The patient's history has been reviewed, patient examined, no change in status, stable for surgery.  I have reviewed the patient's chart and labs.  Questions were answered to the patient's satisfaction.     [place], [place]

## 2019-11-06 ENCOUNTER — Encounter: Payer: Self-pay | Admitting: *Deleted

## 2019-11-06 LAB — SURGICAL PATHOLOGY

## 2020-03-15 ENCOUNTER — Ambulatory Visit: Payer: Medicare Other | Attending: Internal Medicine

## 2020-03-15 DIAGNOSIS — Z23 Encounter for immunization: Secondary | ICD-10-CM

## 2020-03-15 NOTE — Progress Notes (Signed)
   Covid-19 Vaccination Clinic  Name:  AMARACHI KOTZ    MRN: [id number] DOB: 1950/11/23  03/15/2020  Ms. Gorczyca was observed post Covid-19 immunization for 15 minutes without incident. She was provided with Vaccine Information Sheet and instruction to access the V-Safe system.   Ms. Henrickson was instructed to call 911 with any severe reactions post vaccine: Marland Kitchen Difficulty breathing  . Swelling of face and throat  . A fast heartbeat  . A bad rash all over body  . Dizziness and weakness

## 2020-12-14 ENCOUNTER — Encounter: Payer: Self-pay | Admitting: Ophthalmology

## 2020-12-28 ENCOUNTER — Ambulatory Visit: Payer: Medicare Other | Admitting: Anesthesiology

## 2020-12-28 ENCOUNTER — Encounter
Admission: RE | Disposition: A | Discharge status: Home / Self Care | Payer: Self-pay | Source: Home / Self Care | Attending: Ophthalmology

## 2020-12-28 ENCOUNTER — Encounter: Payer: Self-pay | Admitting: Ophthalmology

## 2020-12-28 ENCOUNTER — Ambulatory Visit
Admission: RE | Admit: 2020-12-28 | Discharge: 2020-12-28 | Disposition: A | Discharge status: Home / Self Care | Payer: Medicare Other | Attending: Ophthalmology | Admitting: Ophthalmology

## 2020-12-28 ENCOUNTER — Other Ambulatory Visit: Payer: Self-pay

## 2020-12-28 DIAGNOSIS — Z86010 Personal history of colonic polyps: Secondary | ICD-10-CM | POA: Diagnosis not present

## 2020-12-28 DIAGNOSIS — Z881 Allergy status to other antibiotic agents status: Secondary | ICD-10-CM | POA: Insufficient documentation

## 2020-12-28 DIAGNOSIS — E7800 Pure hypercholesterolemia, unspecified: Secondary | ICD-10-CM | POA: Diagnosis not present

## 2020-12-28 DIAGNOSIS — Z90710 Acquired absence of both cervix and uterus: Secondary | ICD-10-CM | POA: Insufficient documentation

## 2020-12-28 DIAGNOSIS — Z9012 Acquired absence of left breast and nipple: Secondary | ICD-10-CM | POA: Diagnosis not present

## 2020-12-28 DIAGNOSIS — Z7982 Long term (current) use of aspirin: Secondary | ICD-10-CM | POA: Diagnosis not present

## 2020-12-28 DIAGNOSIS — Z79899 Other long term (current) drug therapy: Secondary | ICD-10-CM | POA: Diagnosis not present

## 2020-12-28 DIAGNOSIS — Z85820 Personal history of malignant melanoma of skin: Secondary | ICD-10-CM | POA: Diagnosis not present

## 2020-12-28 DIAGNOSIS — E1136 Type 2 diabetes mellitus with diabetic cataract: Secondary | ICD-10-CM | POA: Insufficient documentation

## 2020-12-28 DIAGNOSIS — Z87891 Personal history of nicotine dependence: Secondary | ICD-10-CM | POA: Insufficient documentation

## 2020-12-28 DIAGNOSIS — I10 Essential (primary) hypertension: Secondary | ICD-10-CM | POA: Diagnosis not present

## 2020-12-28 DIAGNOSIS — Z853 Personal history of malignant neoplasm of breast: Secondary | ICD-10-CM | POA: Insufficient documentation

## 2020-12-28 DIAGNOSIS — Z7984 Long term (current) use of oral hypoglycemic drugs: Secondary | ICD-10-CM | POA: Diagnosis not present

## 2020-12-28 DIAGNOSIS — H2512 Age-related nuclear cataract, left eye: Secondary | ICD-10-CM | POA: Diagnosis not present

## 2020-12-28 HISTORY — DX: Family history of other specified conditions: Z84.89

## 2020-12-28 HISTORY — PX: CATARACT EXTRACTION W/PHACO: SHX586

## 2020-12-28 LAB — GLUCOSE, CAPILLARY
Glucose-Capillary: 160 mg/dL — ABNORMAL HIGH (ref 70–99)
Glucose-Capillary: 128 mg/dL — ABNORMAL HIGH (ref 70–99)

## 2020-12-28 SURGERY — PHACOEMULSIFICATION, CATARACT, WITH IOL INSERTION
Anesthesia: Monitor Anesthesia Care | Site: Eye | Laterality: Left

## 2020-12-28 MED ORDER — TETRACAINE HCL 0.5 % OP SOLN
1.0000 [drp] | OPHTHALMIC | Status: DC | PRN
  Administered 2020-12-28 (×3): 1 [drp] via OPHTHALMIC

## 2020-12-28 MED ORDER — PHENYLEPHRINE HCL 10 % OP SOLN
1.0000 [drp] | OPHTHALMIC | Status: DC | PRN
  Administered 2020-12-28 (×3): 1 [drp] via OPHTHALMIC

## 2020-12-28 MED ORDER — SIGHTPATH DOSE#1 BSS IO SOLN
INTRAOCULAR | Status: DC | PRN
  Administered 2020-12-28: 1 mL

## 2020-12-28 MED ORDER — BRIMONIDINE TARTRATE-TIMOLOL 0.2-0.5 % OP SOLN
OPHTHALMIC | Status: DC | PRN
  Administered 2020-12-28: 1 [drp] via OPHTHALMIC

## 2020-12-28 MED ORDER — SIGHTPATH DOSE#1 NA CHONDROIT SULF-NA HYALURON 40-17 MG/ML IO SOLN
INTRAOCULAR | Status: DC | PRN
  Administered 2020-12-28: 1 mL via INTRAOCULAR

## 2020-12-28 MED ORDER — CYCLOPENTOLATE HCL 2 % OP SOLN
1.0000 [drp] | OPHTHALMIC | Status: DC | PRN
  Administered 2020-12-28 (×3): 1 [drp] via OPHTHALMIC

## 2020-12-28 MED ORDER — LACTATED RINGERS IV SOLN: INTRAVENOUS | Status: DC

## 2020-12-28 MED ORDER — MIDAZOLAM HCL 2 MG/2ML IJ SOLN
INTRAMUSCULAR | Status: DC | PRN
  Administered 2020-12-28 (×2): 1 mg via INTRAVENOUS

## 2020-12-28 MED ORDER — MOXIFLOXACIN HCL 0.5 % OP SOLN
OPHTHALMIC | Status: DC | PRN
  Administered 2020-12-28: 0.2 mL via OPHTHALMIC

## 2020-12-28 MED ORDER — SIGHTPATH DOSE#1 BSS IO SOLN
INTRAOCULAR | Status: DC | PRN
  Administered 2020-12-28: 56 mL via OPHTHALMIC

## 2020-12-28 MED ORDER — FENTANYL CITRATE (PF) 100 MCG/2ML IJ SOLN
INTRAMUSCULAR | Status: DC | PRN
  Administered 2020-12-28: 50 ug via INTRAVENOUS

## 2020-12-28 MED ORDER — SIGHTPATH DOSE#1 BSS IO SOLN
INTRAOCULAR | Status: DC | PRN
  Administered 2020-12-28: 15 mL

## 2020-12-28 SURGICAL SUPPLY — 16 items
CANNULA ANT/CHMB 27GA (MISCELLANEOUS) ×4 IMPLANT
GLOVE SURG ENC TEXT LTX SZ8 (GLOVE) ×4 IMPLANT
GLOVE SURG TRIUMPH 8.0 PF LTX (GLOVE) ×2 IMPLANT
GOWN STRL REUS W/ TWL LRG LVL3 (GOWN DISPOSABLE) ×2 IMPLANT
GOWN STRL REUS W/TWL LRG LVL3 (GOWN DISPOSABLE) ×4
LENS IOL TECNIS EYHANCE 20.5 (Intraocular Lens) ×2 IMPLANT
MARKER SKIN DUAL TIP RULER LAB (MISCELLANEOUS) ×2 IMPLANT
NEEDLE FILTER BLUNT 18X 1/2SAF (NEEDLE) ×1
NEEDLE FILTER BLUNT 18X1 1/2 (NEEDLE) ×1 IMPLANT
PACK EYE AFTER SURG (MISCELLANEOUS) ×2 IMPLANT
SUT ETHILON 10-0 CS-B-6CS-B-6 (SUTURE)
SUTURE EHLN 10-0 CS-B-6CS-B-6 (SUTURE) IMPLANT
SYR 3ML LL SCALE MARK (SYRINGE) ×2 IMPLANT
SYR TB 1ML LUER SLIP (SYRINGE) ×2 IMPLANT
WATER STERILE IRR 250ML POUR (IV SOLUTION) ×2 IMPLANT
WIPE NON LINTING 3.25X3.25 (MISCELLANEOUS) ×2 IMPLANT

## 2020-12-28 NOTE — Transfer of Care (Signed)
Immediate Anesthesia Transfer of Care Note  Patient: Jacqueline Warren  Procedure(s) Performed: CATARACT EXTRACTION PHACO AND INTRAOCULAR LENS PLACEMENT (IOC) LEFT DIABETIC (Left: Eye)  Patient Location: PACU  Anesthesia Type: MAC  Level of Consciousness: awake, alert  and patient cooperative  Airway and Oxygen Therapy: Patient Spontanous Breathing   Post-op Assessment: Post-op Vital signs reviewed, Patient's Cardiovascular Status Stable, Respiratory Function Stable, Patent Airway and No signs of Nausea or vomiting  Post-op Vital Signs: Reviewed and stable  Complications: No notable events documented.

## 2020-12-28 NOTE — H&P (Signed)
[facility]   Primary Care Physician:  Gayland Curry, MD Ophthalmologist: Dr. George Ina  Pre-Procedure History & Physical: HPI:  Jacqueline Warren is a 70 y.o. female here for cataract surgery.   Past Medical History:  Diagnosis Date   Adrenal mass ([place])    Anxiety    Arthritis    fingers   Breast CA ([place])    bilateral   Colon adenomas    Diabetes mellitus without complication (HCC)    Family history of adverse reaction to anesthesia    Mother & Sisters - PONV   GERD (gastroesophageal reflux disease)    Hiatal hernia    History of blood transfusion    Hypercholesteremia    Hypertension    IBS (irritable bowel syndrome)    Kidney stones    Lymphedema    mild - both arms   Mass of adrenal gland ([place])    Motion sickness    boats, planes   Pancreatic cyst    Pancreatic cyst    Peripheral neuropathy    feet, secondary to DM   Peripheral neuropathy    PONV (postoperative nausea and vomiting)    Seasonal allergies    Skin cancer    melanoma of back; nose    Past Surgical History:  Procedure Laterality Date   ABDOMINAL HYSTERECTOMY     BREAST LUMPECTOMY Bilateral    BROW LIFT Bilateral 02/16/2015   Procedure: BLEPHAROPLASTY UPPER EYELID W/EXCESS SKIN;  Surgeon: Karle Starch, MD;  Location: [place];  Service: Ophthalmology;  Laterality: Bilateral;  DIABETIC - oral meds   COLONOSCOPY     COLONOSCOPY WITH PROPOFOL N/A 10/14/2015   Procedure: COLONOSCOPY WITH PROPOFOL;  Surgeon: Manya Silvas, MD;  Location: [facility] ENDOSCOPY;  Service: Endoscopy;  Laterality: N/A;   COLONOSCOPY WITH PROPOFOL N/A 11/05/2019   Procedure: COLONOSCOPY WITH PROPOFOL;  Surgeon: Toledo, Benay Pike, MD;  Location: [facility] ENDOSCOPY;  Service: Gastroenterology;  Laterality: N/A;   INSERTION CENTRAL VENOUS ACCESS DEVICE W/ SUBCUTANEOUS PORT     port-a-cath; now removed   MASTECTOMY Left    partial   MOHS SURGERY     nose   PTOSIS REPAIR Bilateral 02/16/2015   Procedure: PTOSIS REPAIR;   Surgeon: Karle Starch, MD;  Location: [place];  Service: Ophthalmology;  Laterality: Bilateral;   SKIN CANCER EXCISION     back   TONSILLECTOMY     TUBAL LIGATION      Prior to Admission medications   Medication Sig Start Date End Date Taking? Authorizing Provider  acetaminophen (TYLENOL) 325 MG tablet Take 650 mg by mouth every 6 (six) hours as needed.   Yes [provider]  ASCORBIC ACID CR PO Take by mouth.   Yes [provider]  aspirin 81 MG tablet Take 81 mg by mouth daily.   Yes [provider]  atorvastatin (LIPITOR) 40 MG tablet Take 40 mg by mouth daily. PM   Yes [provider]  carvedilol (COREG) 25 MG tablet Take 25 mg by mouth 2 (two) times daily with a meal.   Yes [provider]  Cholecalciferol (VITAMIN D3 PO) Take by mouth.   Yes [provider]  docusate sodium (COLACE) 50 MG capsule Take 50 mg by mouth 2 (two) times daily.   Yes [provider]  esomeprazole (NEXIUM) 40 MG capsule Take 40 mg by mouth daily at 12 noon. AM   Yes [provider]  ferrous sulfate 325 (65 FE) MG tablet  Take 325 mg by mouth daily with breakfast.   Yes [provider]  gabapentin (NEURONTIN) 300 MG capsule Take 900 mg by mouth 3 (three) times daily.   Yes [provider]  ibuprofen (ADVIL,MOTRIN) 200 MG tablet Take 200 mg by mouth every 6 (six) hours as needed.   Yes [provider]  Loratadine 10 MG CAPS Take by mouth. AM   Yes [provider]  Magnesium Oxide (MAG-OX PO) Take by mouth.   Yes [provider]  metFORMIN (GLUCOPHAGE) 1000 MG tablet Take 500 mg by mouth 2 (two) times daily with a meal.   Yes [provider]  morphine (MS CONTIN) 15 MG 12 hr tablet Take 15 mg by mouth every 12 (twelve) hours.   Yes [provider]  Multiple Vitamins-Minerals (MULTIVITAMIN PO) Take by mouth. AM   Yes [provider]  oxycodone (OXY-IR) 5 MG  capsule Take 5 mg by mouth 2 (two) times daily. Midday and suppertime   Yes [provider]  potassium chloride SA (K-DUR,KLOR-CON) 20 MEQ tablet Take 20 mEq by mouth daily. AM   Yes [provider]  Pyridoxine HCl (VITAMIN B-6 PO) Take by mouth. AM   Yes [provider]  valsartan (DIOVAN) 80 MG tablet Take 80 mg by mouth daily. AM   Yes [provider]    Allergies as of 11/09/2020 - Review Complete 11/05/2019  Allergen Reaction Noted   Adhesive [tape] Other (See Comments) 02/08/2015   Clindamycin/lincomycin Nausea And Vomiting 02/08/2015    History reviewed. No pertinent family history.  Social History   Socioeconomic History   Marital status: Divorced    Spouse name: Not on file   Number of children: Not on file   Years of education: Not on file   Highest education level: Not on file  Occupational History   Not on file  Tobacco Use   Smoking status: Former    Types: Cigarettes    Quit date: 06/12/1968    Years since quitting: 52.5   Smokeless tobacco: Never  Vaping Use   Vaping Use: Never used  Substance and Sexual Activity   Alcohol use: No   Drug use: No   Sexual activity: Not on file  Other Topics Concern   Not on file  Social History Narrative   Not on file   Social Determinants of Health   Financial Resource Strain: Not on file  Food Insecurity: Not on file  Transportation Needs: Not on file  Physical Activity: Not on file  Stress: Not on file  Social Connections: Not on file  Intimate Partner Violence: Not on file    Review of Systems: See HPI, otherwise negative ROS  Physical Exam: BP (!) 183/93   Pulse 73   Temp (!) 97.5 F (36.4 C) (Temporal)   Resp 20   Ht 5\' 5"  (1.651 m)   Wt 106.1 kg   SpO2 99%   BMI 38.94 kg/m  General:   Alert, cooperative in NAD Head:  Normocephalic and atraumatic. Respiratory:  Normal work of breathing. Cardiovascular:  RRR  Impression/Plan: Jacqueline Warren is here for  cataract surgery.  Risks, benefits, limitations, and alternatives regarding cataract surgery have been reviewed with the patient.  Questions have been answered.  All parties agreeable.   Birder Robson, MD  12/28/2020, 9:10 AM

## 2020-12-28 NOTE — Anesthesia Postprocedure Evaluation (Signed)
Anesthesia Post Note  Patient: Jacqueline Warren  Procedure(s) Performed: CATARACT EXTRACTION PHACO AND INTRAOCULAR LENS PLACEMENT (IOC) LEFT DIABETIC (Left: Eye)     Patient location during evaluation: PACU Anesthesia Type: MAC Level of consciousness: awake and alert Pain management: pain level controlled Vital Signs Assessment: post-procedure vital signs reviewed and stable Respiratory status: spontaneous breathing and nonlabored ventilation Cardiovascular status: Elevated BP in PACU. Denies headaches or chest pain. BP cuff on leg with patient sitting in upright position. Patient states she cannot have BP cuff on arm. Patient will take BP meds upon arrival at home. Discussed reasons she should seek medical attention. Postop Assessment: no apparent nausea or vomiting Anesthetic complications: no   No notable events documented.  Mumin Denomme [employer]

## 2020-12-28 NOTE — Op Note (Signed)
PREOPERATIVE DIAGNOSIS:  Nuclear sclerotic cataract of the left eye.   POSTOPERATIVE DIAGNOSIS:  Nuclear sclerotic cataract of the left eye.   OPERATIVE PROCEDURE:ORPROCALL@   SURGEON:  Birder Robson, MD.   ANESTHESIA:  Anesthesiologist: Ardeth Sportsman, MD CRNA: Vanetta Shawl, CRNA  1.      Managed anesthesia care. 2.     0.84ml of Shugarcaine was instilled following the paracentesis   COMPLICATIONS:  None.   TECHNIQUE:   Stop and chop   DESCRIPTION OF PROCEDURE:  The patient was examined and consented in the preoperative holding area where the aforementioned topical anesthesia was applied to the left eye and then brought back to the Operating Room where the left eye was prepped and draped in the usual sterile ophthalmic fashion and a lid speculum was placed. A paracentesis was created with the side port blade and the anterior chamber was filled with viscoelastic. A near clear corneal incision was performed with the steel keratome. A continuous curvilinear capsulorrhexis was performed with a cystotome followed by the capsulorrhexis forceps. Hydrodissection and hydrodelineation were carried out with BSS on a blunt cannula. The lens was removed in a stop and chop  technique and the remaining cortical material was removed with the irrigation-aspiration handpiece. The capsular bag was inflated with viscoelastic and the Technis [id number] lens was placed in the capsular bag without complication. The remaining viscoelastic was removed from the eye with the irrigation-aspiration handpiece. The wounds were hydrated. The anterior chamber was flushed with BSS and the eye was inflated to physiologic pressure. 0.26ml Vigamox was placed in the anterior chamber. The wounds were found to be water tight. The eye was dressed with Combigan. The patient was given protective glasses to wear throughout the day and a shield with which to sleep tonight. The patient was also given drops with which to begin a drop regimen  today and will follow-up with me in one day. Implant Name Type Inv. Item Serial No. Manufacturer Lot No. LRB No. Used Action  LENS IOL TECNIS EYHANCE 20.5 - [id number] Intraocular Lens LENS IOL TECNIS EYHANCE 20.5 [id number] JOHNSON   Left 1 Implanted    Procedure(s) with comments: CATARACT EXTRACTION PHACO AND INTRAOCULAR LENS PLACEMENT (IOC) LEFT DIABETIC (Left) - 9.37 0:50.7  Electronically signed: Birder Robson 12/28/2020 9:31 AM

## 2020-12-28 NOTE — Anesthesia Procedure Notes (Signed)
Procedure Name: MAC Date/Time: 12/28/2020 9:17 AM Performed by: Vanetta Shawl, CRNA Pre-anesthesia Checklist: Patient identified, Emergency Drugs available, Suction available, Timeout performed and Patient being monitored Patient Re-evaluated:Patient Re-evaluated prior to induction Oxygen Delivery Method: Nasal cannula Placement Confirmation: positive ETCO2

## 2020-12-28 NOTE — Anesthesia Preprocedure Evaluation (Signed)
Anesthesia Evaluation  Patient identified by MRN, date of birth, ID band Patient awake    Reviewed: Allergy & Precautions, NPO status , Patient's Chart, lab work & pertinent test results  History of Anesthesia Complications (+) PONV and history of anesthetic complications  Airway Mallampati: II  TM Distance: >3 FB Neck ROM: Full    Dental no notable dental hx.    Pulmonary former smoker,    Pulmonary exam normal        Cardiovascular hypertension, Normal cardiovascular exam     Neuro/Psych PSYCHIATRIC DISORDERS Anxiety  Neuromuscular disease    GI/Hepatic hiatal hernia, GERD  Controlled,  Endo/Other  diabetes, Type 2  Renal/GU Renal disease (Nephrolithiasis)     Musculoskeletal  (+) Arthritis , Osteoarthritis,    Abdominal (+) + obese,   Peds  Hematology negative hematology ROS (+)   Anesthesia Other Findings   Reproductive/Obstetrics                            Anesthesia Physical Anesthesia Plan  ASA: 3  Anesthesia Plan: MAC   Post-op Pain Management:    Induction: Intravenous  PONV Risk Score and Plan: 3 and Midazolam, Treatment may vary due to age or medical condition and TIVA  Airway Management Planned: Natural Airway and Nasal Cannula  Additional Equipment: None  Intra-op Plan:   Post-operative Plan:   Informed Consent: I have reviewed the patients History and Physical, chart, labs and discussed the procedure including the risks, benefits and alternatives for the proposed anesthesia with the patient or authorized representative who has indicated his/her understanding and acceptance.     Dental advisory given  Plan Discussed with: CRNA  Anesthesia Plan Comments:         Anesthesia Quick Evaluation

## 2020-12-29 ENCOUNTER — Encounter: Payer: Self-pay | Admitting: Ophthalmology

## 2021-01-02 ENCOUNTER — Other Ambulatory Visit: Payer: Self-pay

## 2021-01-02 DIAGNOSIS — Z79899 Other long term (current) drug therapy: Secondary | ICD-10-CM | POA: Insufficient documentation

## 2021-01-02 DIAGNOSIS — Z853 Personal history of malignant neoplasm of breast: Secondary | ICD-10-CM | POA: Diagnosis not present

## 2021-01-02 DIAGNOSIS — R110 Nausea: Secondary | ICD-10-CM | POA: Insufficient documentation

## 2021-01-02 DIAGNOSIS — R55 Syncope and collapse: Secondary | ICD-10-CM | POA: Diagnosis not present

## 2021-01-02 DIAGNOSIS — Z7982 Long term (current) use of aspirin: Secondary | ICD-10-CM | POA: Diagnosis not present

## 2021-01-02 DIAGNOSIS — R42 Dizziness and giddiness: Secondary | ICD-10-CM | POA: Diagnosis present

## 2021-01-02 DIAGNOSIS — Z9013 Acquired absence of bilateral breasts and nipples: Secondary | ICD-10-CM | POA: Insufficient documentation

## 2021-01-02 DIAGNOSIS — I10 Essential (primary) hypertension: Secondary | ICD-10-CM | POA: Insufficient documentation

## 2021-01-02 DIAGNOSIS — Z85828 Personal history of other malignant neoplasm of skin: Secondary | ICD-10-CM | POA: Insufficient documentation

## 2021-01-02 DIAGNOSIS — Z87891 Personal history of nicotine dependence: Secondary | ICD-10-CM | POA: Diagnosis not present

## 2021-01-02 LAB — CBC
HCT: 38.7 % (ref 36.0–46.0)
Hemoglobin: 13 g/dL (ref 12.0–15.0)
MCH: 28.8 pg (ref 26.0–34.0)
MCHC: 33.6 g/dL (ref 30.0–36.0)
MCV: 85.8 fL (ref 80.0–100.0)
Platelets: 214 10*3/uL (ref 150–400)
RBC: 4.51 MIL/uL (ref 3.87–5.11)
RDW: 13.1 % (ref 11.5–15.5)
WBC: 7.3 10*3/uL (ref 4.0–10.5)
nRBC: 0 % (ref 0.0–0.2)

## 2021-01-02 LAB — BASIC METABOLIC PANEL
Anion gap: 6 (ref 5–15)
BUN: 11 mg/dL (ref 8–23)
CO2: 28 mmol/L (ref 22–32)
Calcium: 9.3 mg/dL (ref 8.9–10.3)
Chloride: 104 mmol/L (ref 98–111)
Creatinine, Ser: 0.66 mg/dL (ref 0.44–1.00)
GFR, Estimated: >60 mL/min (ref >60)
Glucose, Bld: 131 mg/dL — ABNORMAL HIGH (ref 70–99)
Potassium: 3.5 mmol/L (ref 3.5–5.1)
Sodium: 138 mmol/L (ref 135–145)

## 2021-01-02 LAB — URINALYSIS, COMPLETE (UACMP) WITH MICROSCOPIC
Bacteria, UA: NONE SEEN
Bilirubin Urine: NEGATIVE
Glucose, UA: NEGATIVE mg/dL
Hgb urine dipstick: NEGATIVE
Ketones, ur: NEGATIVE mg/dL
Leukocytes,Ua: NEGATIVE
Nitrite: NEGATIVE
Protein, ur: NEGATIVE mg/dL
Specific Gravity, Urine: 1.005 (ref 1.005–1.030)
pH: 6 (ref 5.0–8.0)

## 2021-01-02 LAB — TROPONIN I (HIGH SENSITIVITY): Troponin I (High Sensitivity): 4 ng/L (ref <18)

## 2021-01-02 NOTE — ED Notes (Signed)
Patient presents to the ED with EMS. EMS reports HTN in the ambulance. Patient presents with complaints of near syncope, weakness, dizziness, and HTN at home. Patient also reports vomiting as well. Patient reports recent eye surgery on 7/19. Patient denies headache, AMS, confusion, or other neuro symptoms. Patient denies chest pain.

## 2021-01-02 NOTE — ED Triage Notes (Signed)
See note. Patient arrives via EMS for dizziness.

## 2021-01-02 NOTE — ED Notes (Signed)
Per other patients, patient in restroom at this time. Will call for triage again.

## 2021-01-02 NOTE — ED Notes (Signed)
Urine cup provided to patient. Patient verbalized understanding of urine sample ordered.

## 2021-01-03 ENCOUNTER — Emergency Department
Admission: EM | Admit: 2021-01-03 | Discharge: 2021-01-03 | Disposition: A | Discharge status: Home / Self Care | Payer: Medicare Other | Attending: Emergency Medicine | Admitting: Emergency Medicine

## 2021-01-03 DIAGNOSIS — R42 Dizziness and giddiness: Secondary | ICD-10-CM | POA: Diagnosis not present

## 2021-01-03 DIAGNOSIS — R55 Syncope and collapse: Secondary | ICD-10-CM

## 2021-01-03 NOTE — ED Provider Notes (Signed)
[facility] Emergency Department Provider Note   ____________________________________________   Event Date/Time   First MD Initiated Contact with Patient 01/03/21 0004     (approximate)  I have reviewed the triage vital signs and the nursing notes.   HISTORY  Chief Complaint Dizziness    HPI Jacqueline Warren is a 70 y.o. female with past medical history of hypertension, hyperlipidemia, diabetes, and anxiety who presents to the ED complaining of dizziness.  Patient reports that she had an episode earlier this evening where she suddenly felt very lightheaded and like she was going to pass out.  She also reports feeling nauseous and like "something was off."  She checked her blood pressure at that time and found it to be 190/110, subsequently decided to call EMS.  She states she has been compliant with her blood pressure medications recently, denies any chest pain or shortness of breath with this episode.  She has been feeling well previously with no fevers, cough, abdominal pain, vomiting, diarrhea, or dysuria.  She now states symptoms are much improved and she no longer feels dizzy.        Past Medical History:  Diagnosis Date   Adrenal mass ([place])    Anxiety    Arthritis    fingers   Breast CA ([place])    bilateral   Colon adenomas    Diabetes mellitus without complication (HCC)    Family history of adverse reaction to anesthesia    Mother & Sisters - PONV   GERD (gastroesophageal reflux disease)    Hiatal hernia    History of blood transfusion    Hypercholesteremia    Hypertension    IBS (irritable bowel syndrome)    Kidney stones    Lymphedema    mild - both arms   Mass of adrenal gland (HCC)    Motion sickness    boats, planes   Pancreatic cyst    Pancreatic cyst    Peripheral neuropathy    feet, secondary to DM   Peripheral neuropathy    PONV (postoperative nausea and vomiting)    Seasonal allergies    Skin cancer    melanoma of back;  nose    There are no problems to display for this patient.   Past Surgical History:  Procedure Laterality Date   ABDOMINAL HYSTERECTOMY     BREAST LUMPECTOMY Bilateral    BROW LIFT Bilateral 02/16/2015   Procedure: BLEPHAROPLASTY UPPER EYELID W/EXCESS SKIN;  Surgeon: Karle Starch, MD;  Location: [place];  Service: Ophthalmology;  Laterality: Bilateral;  DIABETIC - oral meds   CATARACT EXTRACTION W/PHACO Left 12/28/2020   Procedure: CATARACT EXTRACTION PHACO AND INTRAOCULAR LENS PLACEMENT ([place]) LEFT DIABETIC;  Surgeon: Birder Robson, MD;  Location: [place];  Service: Ophthalmology;  Laterality: Left;  9.37 0:50.7   COLONOSCOPY     COLONOSCOPY WITH PROPOFOL N/A 10/14/2015   Procedure: COLONOSCOPY WITH PROPOFOL;  Surgeon: Manya Silvas, MD;  Location: [facility] ENDOSCOPY;  Service: Endoscopy;  Laterality: N/A;   COLONOSCOPY WITH PROPOFOL N/A 11/05/2019   Procedure: COLONOSCOPY WITH PROPOFOL;  Surgeon: Toledo, Benay Pike, MD;  Location: [facility] ENDOSCOPY;  Service: Gastroenterology;  Laterality: N/A;   INSERTION CENTRAL VENOUS ACCESS DEVICE W/ SUBCUTANEOUS PORT     port-a-cath; now removed   MASTECTOMY Left    partial   MOHS SURGERY     nose   PTOSIS REPAIR Bilateral 02/16/2015   Procedure: PTOSIS REPAIR;  Surgeon: Karle Starch, MD;  Location: [place];  Service: Ophthalmology;  Laterality: Bilateral;   SKIN CANCER EXCISION     back   TONSILLECTOMY     TUBAL LIGATION      Prior to Admission medications   Medication Sig Start Date End Date Taking? Authorizing Provider  acetaminophen (TYLENOL) 325 MG tablet Take 650 mg by mouth every 6 (six) hours as needed.    [provider]  ASCORBIC ACID CR PO Take by mouth.    [provider]  aspirin 81 MG tablet Take 81 mg by mouth daily.    [provider]  atorvastatin (LIPITOR) 40 MG tablet Take 40 mg by mouth daily. PM    [provider]  carvedilol (COREG) 25 MG tablet Take 25  mg by mouth 2 (two) times daily with a meal.    [provider]  Cholecalciferol (VITAMIN D3 PO) Take by mouth.    [provider]  docusate sodium (COLACE) 50 MG capsule Take 50 mg by mouth 2 (two) times daily.    [provider]  esomeprazole (NEXIUM) 40 MG capsule Take 40 mg by mouth daily at 12 noon. AM    [provider]  ferrous sulfate 325 (65 FE) MG tablet Take 325 mg by mouth daily with breakfast.    [provider]  gabapentin (NEURONTIN) 300 MG capsule Take 900 mg by mouth 3 (three) times daily.    [provider]  ibuprofen (ADVIL,MOTRIN) 200 MG tablet Take 200 mg by mouth every 6 (six) hours as needed.    [provider]  Loratadine 10 MG CAPS Take by mouth. AM    [provider]  Magnesium Oxide (MAG-OX PO) Take by mouth.    [provider]  metFORMIN (GLUCOPHAGE) 1000 MG tablet Take 500 mg by mouth 2 (two) times daily with a meal.    [provider]  morphine (MS CONTIN) 15 MG 12 hr tablet Take 15 mg by mouth every 12 (twelve) hours.    [provider]  Multiple Vitamins-Minerals (MULTIVITAMIN PO) Take by mouth. AM    [provider]  oxycodone (OXY-IR) 5 MG capsule Take 5 mg by mouth 2 (two) times daily. Midday and suppertime    [provider]  potassium chloride SA (K-DUR,KLOR-CON) 20 MEQ tablet Take 20 mEq by mouth daily. AM    [provider]  Pyridoxine HCl (VITAMIN B-6 PO) Take by mouth. AM    [provider]  valsartan (DIOVAN) 80 MG tablet Take 80 mg by mouth daily. AM    [provider]    Allergies Adhesive [tape], Amoxicillin, Clindamycin/lincomycin, and Crestor [rosuvastatin]  No family history on file.  Social History Social History   Tobacco Use   Smoking status: Former    Types: Cigarettes    Quit date: 06/12/1968    Years since quitting: 52.5   Smokeless tobacco: Never  Vaping Use   Vaping Use: Never used   Substance Use Topics   Alcohol use: No   Drug use: No    Review of Systems  Constitutional: No fever/chills Eyes: No visual changes. ENT: No sore throat. Cardiovascular: Denies chest pain.  Positive for dizziness and lightheadedness. Respiratory: Denies shortness of breath. Gastrointestinal: No abdominal pain.  Positive for nausea, no vomiting.  No diarrhea.  No constipation. Genitourinary: Negative for dysuria. Musculoskeletal: Negative for back pain. Skin: Negative for rash. Neurological: Negative for headaches, focal weakness or numbness.  ____________________________________________   PHYSICAL EXAM:  VITAL SIGNS:  ED Triage Vitals [01/02/21 2000]  Enc Vitals Group     BP (!) 156/96     Pulse Rate 69     Resp 16     Temp 98.6 F (37 C)     Temp Source Oral     SpO2 95 %     Weight 235 lb 14.3 oz (107 kg)     Height '5\' 5"'$  (1.651 m)     Head Circumference      Peak Flow      Pain Score 0     Pain Loc      Pain Edu?      Excl. in [place]?     Constitutional: Alert and oriented. Eyes: Conjunctivae are normal. Head: Atraumatic. Nose: No congestion/rhinnorhea. Mouth/Throat: Mucous membranes are moist. Neck: Normal ROM Cardiovascular: Normal rate, regular rhythm. Grossly normal heart sounds.  2+ radial pulses bilaterally. Respiratory: Normal respiratory effort.  No retractions. Lungs CTAB. Gastrointestinal: Soft and nontender. No distention. Genitourinary: deferred Musculoskeletal: No lower extremity tenderness nor edema. Neurologic:  Normal speech and language. No gross focal neurologic deficits are appreciated. Skin:  Skin is warm, dry and intact. No rash noted. Psychiatric: Mood and affect are normal. Speech and behavior are normal.  ____________________________________________   LABS (all labs ordered are listed, but only abnormal results are displayed)  Labs Reviewed  BASIC METABOLIC PANEL - Abnormal; Notable for the following components:      Result  Value   Glucose, Bld 131 (*)    All other components within normal limits  URINALYSIS, COMPLETE (UACMP) WITH MICROSCOPIC - Abnormal; Notable for the following components:   Color, Urine STRAW (*)    APPearance CLEAR (*)    All other components within normal limits  CBC  CBG MONITORING, ED  TROPONIN I (HIGH SENSITIVITY)  TROPONIN I (HIGH SENSITIVITY)   ____________________________________________  EKG  ED ECG REPORT I, Blake Divine, the attending physician, personally viewed and interpreted this ECG.   Date: 01/03/2021  EKG Time: 20:09  Rate: 72  Rhythm: Normal sinus rhythm with PAC's vs Atrial fibrillation  Axis: Normal  Intervals:right bundle branch block  ST&T Change: None  ED ECG REPORT I, Blake Divine, the attending physician, personally viewed and interpreted this ECG.   Date: 01/03/2021  EKG Time: 00:17  Rate: 61  Rhythm: normal sinus rhythm  Axis: Normal  Intervals:right bundle branch block  ST&T Change: None    PROCEDURES  Procedure(s) performed (including Critical Care):  Procedures   ____________________________________________   INITIAL IMPRESSION / ASSESSMENT AND PLAN / ED COURSE      70 year old female with past medical history of hypertension, hyperlipidemia, diabetes, and anxiety presents to the ED with episode of lightheadedness, dizziness, near syncope, and nausea at which point she noticed her blood pressure was elevated.  Initial EKG showed normal sinus rhythm with PACs versus atrial fibrillation, however with P waves present in some leads I would favor normal sinus rhythm.  Follow-up EKG demonstrates normal sinus rhythm.  No ischemic changes noted and troponin is negative, doubt ACS.  Remainder of labs are unremarkable and now patient feels much better.  Her blood pressure has normalized without intervention.  She is appropriate for discharge home with cardiology or PCP follow-up, was counseled to return to the ED for new or worsening  symptoms, patient agrees with plan.      ____________________________________________   FINAL CLINICAL IMPRESSION(S) / ED DIAGNOSES  Final diagnoses:  Dizziness  Near syncope     ED Discharge  Orders     None        Note:  This document was prepared using Dragon voice recognition software and may include unintentional dictation errors.    Blake Divine, MD 01/03/21 [phone]

## 2021-01-10 NOTE — Discharge Instructions (Signed)

## 2021-01-11 ENCOUNTER — Ambulatory Visit: Payer: Medicare Other | Admitting: Anesthesiology

## 2021-01-11 ENCOUNTER — Encounter
Admission: RE | Disposition: A | Discharge status: Home / Self Care | Payer: Self-pay | Source: Home / Self Care | Attending: Ophthalmology

## 2021-01-11 ENCOUNTER — Encounter: Payer: Self-pay | Admitting: Ophthalmology

## 2021-01-11 ENCOUNTER — Ambulatory Visit
Admission: RE | Admit: 2021-01-11 | Discharge: 2021-01-11 | Disposition: A | Discharge status: Home / Self Care | Payer: Medicare Other | Attending: Ophthalmology | Admitting: Ophthalmology

## 2021-01-11 ENCOUNTER — Other Ambulatory Visit: Payer: Self-pay

## 2021-01-11 DIAGNOSIS — E1136 Type 2 diabetes mellitus with diabetic cataract: Secondary | ICD-10-CM | POA: Insufficient documentation

## 2021-01-11 DIAGNOSIS — H2512 Age-related nuclear cataract, left eye: Secondary | ICD-10-CM | POA: Diagnosis not present

## 2021-01-11 DIAGNOSIS — Z79899 Other long term (current) drug therapy: Secondary | ICD-10-CM | POA: Diagnosis not present

## 2021-01-11 DIAGNOSIS — Z881 Allergy status to other antibiotic agents status: Secondary | ICD-10-CM | POA: Insufficient documentation

## 2021-01-11 DIAGNOSIS — Z91048 Other nonmedicinal substance allergy status: Secondary | ICD-10-CM | POA: Diagnosis not present

## 2021-01-11 DIAGNOSIS — Z7984 Long term (current) use of oral hypoglycemic drugs: Secondary | ICD-10-CM | POA: Diagnosis not present

## 2021-01-11 HISTORY — PX: CATARACT EXTRACTION W/PHACO: SHX586

## 2021-01-11 LAB — GLUCOSE, CAPILLARY
Glucose-Capillary: 133 mg/dL — ABNORMAL HIGH (ref 70–99)
Glucose-Capillary: 128 mg/dL — ABNORMAL HIGH (ref 70–99)

## 2021-01-11 SURGERY — PHACOEMULSIFICATION, CATARACT, WITH IOL INSERTION
Anesthesia: Monitor Anesthesia Care | Site: Eye | Laterality: Right

## 2021-01-11 MED ORDER — TETRACAINE HCL 0.5 % OP SOLN
1.0000 [drp] | OPHTHALMIC | Status: DC | PRN
  Administered 2021-01-11 (×3): 1 [drp] via OPHTHALMIC

## 2021-01-11 MED ORDER — SIGHTPATH DOSE#1 BSS IO SOLN
INTRAOCULAR | Status: DC | PRN
  Administered 2021-01-11: 1 mL via INTRAMUSCULAR

## 2021-01-11 MED ORDER — MOXIFLOXACIN HCL 0.5 % OP SOLN
OPHTHALMIC | Status: DC | PRN
  Administered 2021-01-11: 0.2 mL via OPHTHALMIC

## 2021-01-11 MED ORDER — SIGHTPATH DOSE#1 BSS IO SOLN
INTRAOCULAR | Status: DC | PRN
  Administered 2021-01-11: 15 mL

## 2021-01-11 MED ORDER — CYCLOPENTOLATE HCL 2 % OP SOLN
1.0000 [drp] | OPHTHALMIC | Status: AC
  Administered 2021-01-11 (×3): 1 [drp] via OPHTHALMIC

## 2021-01-11 MED ORDER — PHENYLEPHRINE HCL 10 % OP SOLN
1.0000 [drp] | OPHTHALMIC | Status: AC
  Administered 2021-01-11 (×3): 1 [drp] via OPHTHALMIC

## 2021-01-11 MED ORDER — BRIMONIDINE TARTRATE-TIMOLOL 0.2-0.5 % OP SOLN
OPHTHALMIC | Status: DC | PRN
  Administered 2021-01-11: 1 [drp] via OPHTHALMIC

## 2021-01-11 MED ORDER — SIGHTPATH DOSE#1 BSS IO SOLN
INTRAOCULAR | Status: DC | PRN
  Administered 2021-01-11: 44 mL via OPHTHALMIC

## 2021-01-11 MED ORDER — FENTANYL CITRATE (PF) 100 MCG/2ML IJ SOLN
INTRAMUSCULAR | Status: DC | PRN
  Administered 2021-01-11: 100 ug via INTRAVENOUS

## 2021-01-11 MED ORDER — SIGHTPATH DOSE#1 NA CHONDROIT SULF-NA HYALURON 40-17 MG/ML IO SOLN
INTRAOCULAR | Status: DC | PRN
  Administered 2021-01-11: 1 mL via INTRAOCULAR

## 2021-01-11 MED ORDER — MIDAZOLAM HCL 2 MG/2ML IJ SOLN
INTRAMUSCULAR | Status: DC | PRN
  Administered 2021-01-11: 2 mg via INTRAVENOUS

## 2021-01-11 SURGICAL SUPPLY — 14 items
CANNULA ANT/CHMB 27GA (MISCELLANEOUS) ×4 IMPLANT
GLOVE SURG ENC TEXT LTX SZ8 (GLOVE) ×2 IMPLANT
GLOVE SURG TRIUMPH 8.0 PF LTX (GLOVE) ×2 IMPLANT
GOWN STRL REUS W/ TWL LRG LVL3 (GOWN DISPOSABLE) ×2 IMPLANT
GOWN STRL REUS W/TWL LRG LVL3 (GOWN DISPOSABLE) ×4
LENS IOL TECNIS EYHANCE 19.5 (Intraocular Lens) ×2 IMPLANT
MARKER SKIN DUAL TIP RULER LAB (MISCELLANEOUS) ×2 IMPLANT
NEEDLE FILTER BLUNT 18X 1/2SAF (NEEDLE) ×1
NEEDLE FILTER BLUNT 18X1 1/2 (NEEDLE) ×1 IMPLANT
PACK EYE AFTER SURG (MISCELLANEOUS) ×2 IMPLANT
SYR 3ML LL SCALE MARK (SYRINGE) ×2 IMPLANT
SYR TB 1ML LUER SLIP (SYRINGE) ×2 IMPLANT
WATER STERILE IRR 250ML POUR (IV SOLUTION) ×2 IMPLANT
WIPE NON LINTING 3.25X3.25 (MISCELLANEOUS) ×2 IMPLANT

## 2021-01-11 NOTE — H&P (Signed)
[facility]   Primary Care Physician:  Gayland Curry, MD Ophthalmologist: Dr. Benay Pillow  Pre-Procedure History & Physical: HPI:  Jacqueline Warren is a 70 y.o. female here for cataract surgery.   Past Medical History:  Diagnosis Date   Adrenal mass ([place])    Anxiety    Arthritis    fingers   Breast CA ([place])    bilateral   Colon adenomas    Diabetes mellitus without complication (HCC)    Family history of adverse reaction to anesthesia    Mother & Sisters - PONV   GERD (gastroesophageal reflux disease)    Hiatal hernia    History of blood transfusion    Hypercholesteremia    Hypertension    IBS (irritable bowel syndrome)    Kidney stones    Lymphedema    mild - both arms   Mass of adrenal gland ([place])    Motion sickness    boats, planes   Pancreatic cyst    Pancreatic cyst    Peripheral neuropathy    feet, secondary to DM   Peripheral neuropathy    PONV (postoperative nausea and vomiting)    Seasonal allergies    Skin cancer    melanoma of back; nose    Past Surgical History:  Procedure Laterality Date   ABDOMINAL HYSTERECTOMY     BREAST LUMPECTOMY Bilateral    BROW LIFT Bilateral 02/16/2015   Procedure: BLEPHAROPLASTY UPPER EYELID W/EXCESS SKIN;  Surgeon: Karle Starch, MD;  Location: [place];  Service: Ophthalmology;  Laterality: Bilateral;  DIABETIC - oral meds   CATARACT EXTRACTION W/PHACO Left 12/28/2020   Procedure: CATARACT EXTRACTION PHACO AND INTRAOCULAR LENS PLACEMENT ([place]) LEFT DIABETIC;  Surgeon: Birder Robson, MD;  Location: [place];  Service: Ophthalmology;  Laterality: Left;  9.37 0:50.7   COLONOSCOPY     COLONOSCOPY WITH PROPOFOL N/A 10/14/2015   Procedure: COLONOSCOPY WITH PROPOFOL;  Surgeon: Manya Silvas, MD;  Location: [facility] ENDOSCOPY;  Service: Endoscopy;  Laterality: N/A;   COLONOSCOPY WITH PROPOFOL N/A 11/05/2019   Procedure: COLONOSCOPY WITH PROPOFOL;  Surgeon: Toledo, Benay Pike, MD;  Location: [facility]  ENDOSCOPY;  Service: Gastroenterology;  Laterality: N/A;   INSERTION CENTRAL VENOUS ACCESS DEVICE W/ SUBCUTANEOUS PORT     port-a-cath; now removed   MASTECTOMY Left    partial   MOHS SURGERY     nose   PTOSIS REPAIR Bilateral 02/16/2015   Procedure: PTOSIS REPAIR;  Surgeon: Karle Starch, MD;  Location: [place];  Service: Ophthalmology;  Laterality: Bilateral;   SKIN CANCER EXCISION     back   TONSILLECTOMY     TUBAL LIGATION      Prior to Admission medications   Medication Sig Start Date End Date Taking? Authorizing Provider  acetaminophen (TYLENOL) 325 MG tablet Take 650 mg by mouth every 6 (six) hours as needed.   Yes [provider]  ASCORBIC ACID CR PO Take by mouth.   Yes [provider]  aspirin 81 MG tablet Take 81 mg by mouth daily.   Yes [provider]  atorvastatin (LIPITOR) 40 MG tablet Take 40 mg by mouth daily. PM   Yes [provider]  carvedilol (COREG) 25 MG tablet Take 25 mg by mouth 2 (two) times daily with a meal.   Yes [provider]  Cholecalciferol (VITAMIN D3 PO) Take by mouth.   Yes [provider]  docusate sodium (COLACE) 50 MG capsule Take 50 mg by mouth  2 (two) times daily.   Yes [provider]  esomeprazole (NEXIUM) 40 MG capsule Take 40 mg by mouth daily at 12 noon. AM   Yes [provider]  ferrous sulfate 325 (65 FE) MG tablet Take 325 mg by mouth daily with breakfast.   Yes [provider]  gabapentin (NEURONTIN) 300 MG capsule Take 900 mg by mouth 3 (three) times daily.   Yes [provider]  ibuprofen (ADVIL,MOTRIN) 200 MG tablet Take 200 mg by mouth every 6 (six) hours as needed.   Yes [provider]  Loratadine 10 MG CAPS Take by mouth. AM   Yes [provider]  Magnesium Oxide (MAG-OX PO) Take by mouth.   Yes [provider]  metFORMIN (GLUCOPHAGE) 1000 MG tablet Take 500 mg by mouth 2 (two) times daily with a meal.    Yes [provider]  morphine (MS CONTIN) 15 MG 12 hr tablet Take 15 mg by mouth every 12 (twelve) hours.   Yes [provider]  Multiple Vitamins-Minerals (MULTIVITAMIN PO) Take by mouth. AM   Yes [provider]  oxycodone (OXY-IR) 5 MG capsule Take 5 mg by mouth 2 (two) times daily. Midday and suppertime   Yes [provider]  potassium chloride SA (K-DUR,KLOR-CON) 20 MEQ tablet Take 20 mEq by mouth daily. AM   Yes [provider]  Pyridoxine HCl (VITAMIN B-6 PO) Take by mouth. AM   Yes [provider]  valsartan (DIOVAN) 80 MG tablet Take 120 mg by mouth daily. AM    [provider]    Allergies as of 11/09/2020 - Review Complete 11/05/2019  Allergen Reaction Noted   Adhesive [tape] Other (See Comments) 02/08/2015   Clindamycin/lincomycin Nausea And Vomiting 02/08/2015    History reviewed. No pertinent family history.  Social History   Socioeconomic History   Marital status: Divorced    Spouse name: Not on file   Number of children: Not on file   Years of education: Not on file   Highest education level: Not on file  Occupational History   Not on file  Tobacco Use   Smoking status: Former    Types: Cigarettes    Quit date: 06/12/1968    Years since quitting: 52.6   Smokeless tobacco: Never  Vaping Use   Vaping Use: Never used  Substance and Sexual Activity   Alcohol use: No   Drug use: No   Sexual activity: Not on file  Other Topics Concern   Not on file  Social History Narrative   Not on file   Social Determinants of Health   Financial Resource Strain: Not on file  Food Insecurity: Not on file  Transportation Needs: Not on file  Physical Activity: Not on file  Stress: Not on file  Social Connections: Not on file  Intimate Partner Violence: Not on file    Review of Systems: See HPI, otherwise negative ROS  Physical Exam: BP (!) 184/105   Pulse 70   Temp (!) 97 F (36.1 C) (Temporal)   Resp  16   Ht '5\' 5"'$  (1.651 m)   Wt 105.7 kg   SpO2 98%   BMI 38.77 kg/m  General:   Alert, cooperative in NAD Head:  Normocephalic and atraumatic. Respiratory:  Normal work of breathing. Cardiovascular:  RRR  Impression/Plan: Jacqueline Warren is here for cataract surgery.  Risks, benefits, limitations, and alternatives regarding cataract surgery have been reviewed with the patient.  Questions have been answered.  All parties agreeable.   Birder Robson, MD  01/11/2021, 9:08 AM

## 2021-01-11 NOTE — Transfer of Care (Addendum)
Immediate Anesthesia Transfer of Care Note  Patient: Jacqueline Warren  Procedure(s) Performed: CATARACT EXTRACTION PHACO AND INTRAOCULAR LENS PLACEMENT (I[facility]) RIGHT DIABETIC (Right: Eye)  Patient Location: PACU  Anesthesia Type: MAC  Level of Consciousness: awake, alert  and patient cooperative  Airway and Oxygen Therapy: Patient Spontanous Breathing and Patient connected to supplemental oxygen  Post-op Assessment: Post-op Vital signs reviewed, Patient's Cardiovascular Status Stable, Respiratory Function Stable, Patent Airway and No signs of Nausea or vomiting  Post-op Vital Signs: Reviewed and stable  Complications: No notable events documented.

## 2021-01-11 NOTE — Op Note (Signed)
PREOPERATIVE DIAGNOSIS:  Nuclear sclerotic cataract of the left eye.   POSTOPERATIVE DIAGNOSIS:  Nuclear sclerotic cataract of the left eye.   OPERATIVE PROCEDURE:ORPROCALL@   SURGEON:  Birder Robson, MD.   ANESTHESIA:  Anesthesiologist: Page, Adele Barthel, MD CRNA: Silvana Newness, CRNA  1.      Managed anesthesia care. 2.     0.13m of Shugarcaine was instilled following the paracentesis   COMPLICATIONS:  None.   TECHNIQUE:   Stop and chop   DESCRIPTION OF PROCEDURE:  The patient was examined and consented in the preoperative holding area where the aforementioned topical anesthesia was applied to the left eye and then brought back to the Operating Room where the left eye was prepped and draped in the usual sterile ophthalmic fashion and a lid speculum was placed. A paracentesis was created with the side port blade and the anterior chamber was filled with viscoelastic. A near clear corneal incision was performed with the steel keratome. A continuous curvilinear capsulorrhexis was performed with a cystotome followed by the capsulorrhexis forceps. Hydrodissection and hydrodelineation were carried out with BSS on a blunt cannula. The lens was removed in a stop and chop  technique and the remaining cortical material was removed with the irrigation-aspiration handpiece. The capsular bag was inflated with viscoelastic and the Technis [id number] lens was placed in the capsular bag without complication. The remaining viscoelastic was removed from the eye with the irrigation-aspiration handpiece. The wounds were hydrated. The anterior chamber was flushed with BSS and the eye was inflated to physiologic pressure. 0.160mVigamox was placed in the anterior chamber. The wounds were found to be water tight. The eye was dressed with Combigan. The patient was given protective glasses to wear throughout the day and a shield with which to sleep tonight. The patient was also given drops with which to begin a drop regimen  today and will follow-up with me in one day. Implant Name Type Inv. Item Serial No. Manufacturer Lot No. LRB No. Used Action  LENS IOL TECNIS EYHANCE 19.5 - [id number] Lens LENS IOL TECNIS EYHANCE 19.5 [id number]   Right 1 Implanted    Procedure(s) with comments: CATARACT EXTRACTION PHACO AND INTRAOCULAR LENS PLACEMENT ([facility]) RIGHT DIABETIC (Right) - Diabetic - oral meds 6.65 00:45.4  Electronically signed: WiBirder Robson/07/2020 9:31 AM

## 2021-01-11 NOTE — Anesthesia Postprocedure Evaluation (Signed)
Anesthesia Post Note  Patient: Jacqueline Warren  Procedure(s) Performed: CATARACT EXTRACTION PHACO AND INTRAOCULAR LENS PLACEMENT ([facility]) RIGHT DIABETIC (Right: Eye)     Patient location during evaluation: PACU Anesthesia Type: MAC Level of consciousness: awake and alert Pain management: pain level controlled Vital Signs Assessment: post-procedure vital signs reviewed and stable Respiratory status: spontaneous breathing Cardiovascular status: blood pressure returned to baseline Postop Assessment: no apparent nausea or vomiting, adequate PO intake and no headache Anesthetic complications: no   No notable events documented.  Adele Barthel Macdonald Rigor

## 2021-01-11 NOTE — Anesthesia Preprocedure Evaluation (Signed)
Anesthesia Evaluation  Patient identified by MRN, date of birth, ID band Patient awake    History of Anesthesia Complications (+) PONV and history of anesthetic complications  Airway Mallampati: II  TM Distance: >3 FB Neck ROM: Full    Dental  (+) Implants   Pulmonary former smoker,    Pulmonary exam normal        Cardiovascular Exercise Tolerance: Good hypertension, Pt. on medications and Pt. on home beta blockers Normal cardiovascular exam     Neuro/Psych negative neurological ROS     GI/Hepatic GERD  Medicated,  Endo/Other  diabetes, Well Controlled, Type 2BMI 38  Renal/GU      Musculoskeletal S/p b/l mastectomies c/b BUE lymphedema   Abdominal   Peds  Hematology   Anesthesia Other Findings   Reproductive/Obstetrics                             Anesthesia Physical Anesthesia Plan  ASA: 3  Anesthesia Plan: MAC   Post-op Pain Management:    Induction: Intravenous  PONV Risk Score and Plan: 3 and TIVA, Midazolam and Treatment may vary due to age or medical condition  Airway Management Planned: Nasal Cannula and Natural Airway  Additional Equipment: None  Intra-op Plan:   Post-operative Plan:   Informed Consent: I have reviewed the patients History and Physical, chart, labs and discussed the procedure including the risks, benefits and alternatives for the proposed anesthesia with the patient or authorized representative who has indicated his/her understanding and acceptance.       Plan Discussed with: CRNA  Anesthesia Plan Comments:         Anesthesia Quick Evaluation

## 2021-01-12 ENCOUNTER — Encounter: Payer: Self-pay | Admitting: Ophthalmology

## 2021-03-22 ENCOUNTER — Ambulatory Visit: Payer: Medicare Other

## 2021-04-01 ENCOUNTER — Other Ambulatory Visit: Payer: Self-pay

## 2021-04-01 ENCOUNTER — Ambulatory Visit: Payer: Medicare Other | Attending: Internal Medicine

## 2021-04-01 DIAGNOSIS — Z23 Encounter for immunization: Secondary | ICD-10-CM

## 2021-04-01 MED ORDER — PFIZER COVID-19 VAC BIVALENT 30 MCG/0.3ML IM SUSP
INTRAMUSCULAR | 0 refills | Status: DC
  Filled 2021-04-01: qty 0.3, 1d supply, fill #0

## 2021-04-01 NOTE — Progress Notes (Signed)
   Covid-19 Vaccination Clinic  Name:  CARISMA TROUPE    MRN: [id number] DOB: 08-Apr-1951  04/01/2021  Ms. Holsapple was observed post Covid-19 immunization for 15 minutes without incident. She was provided with Vaccine Information Sheet and instruction to access the V-Safe system.   Ms. Greis was instructed to call 911 with any severe reactions post vaccine: Difficulty breathing  Swelling of face and throat  A fast heartbeat  A bad rash all over body  Dizziness and weakness   Immunizations Administered     Name Date Dose VIS Date Route   Pfizer Covid-19 Vaccine Bivalent Booster 04/01/2021  2:50 PM 0.3 mL 02/09/2021 Intramuscular   Manufacturer: [place]   Lot: [place]: [id number]      Lu Duffel, PharmD, MBA Clinical Acute Care Pharmacist

## 2021-04-12 ENCOUNTER — Other Ambulatory Visit: Payer: Self-pay

## 2021-06-16 ENCOUNTER — Other Ambulatory Visit: Payer: Self-pay

## 2021-06-17 ENCOUNTER — Other Ambulatory Visit: Payer: Self-pay

## 2021-06-17 MED ORDER — BOOSTRIX 5-2.5-18.5 LF-MCG/0.5 IM SUSY
0.5000 mL | PREFILLED_SYRINGE | Freq: Once | INTRAMUSCULAR | 0 refills | Status: AC
  Filled 2021-06-17 – 2021-06-23 (×2): qty 0.5, 1d supply, fill #0

## 2021-06-17 MED ORDER — ZOSTER VAC RECOMB ADJUVANTED 50 MCG/0.5ML IM SUSR
0.5000 mL | Freq: Once | INTRAMUSCULAR | 1 refills | Status: AC
  Filled 2021-06-17 – 2021-06-23 (×2): qty 0.5, 1d supply, fill #0

## 2021-06-23 ENCOUNTER — Other Ambulatory Visit: Payer: Self-pay

## 2021-06-24 ENCOUNTER — Other Ambulatory Visit: Payer: Self-pay

## 2021-10-21 ENCOUNTER — Other Ambulatory Visit: Payer: Self-pay

## 2021-10-25 ENCOUNTER — Ambulatory Visit: Payer: Medicare Other | Attending: Internal Medicine

## 2021-10-25 ENCOUNTER — Other Ambulatory Visit: Payer: Self-pay

## 2021-10-25 DIAGNOSIS — Z23 Encounter for immunization: Secondary | ICD-10-CM

## 2021-10-25 MED ORDER — PFIZER COVID-19 VAC BIVALENT 30 MCG/0.3ML IM SUSP
INTRAMUSCULAR | 0 refills | Status: DC
  Filled 2021-10-25: qty 0.3, 1d supply, fill #0

## 2021-10-25 NOTE — Progress Notes (Signed)
? ?  Covid-19 Vaccination Clinic ? ?Name:  Jacqueline Warren    ?MRN: [id number] ?DOB: November 22, 1950 ? ?10/25/2021 ? ?[name] was observed post Covid-19 immunization for 15 minutes without incident. She was provided with Vaccine Information Sheet and instruction to access the V-Safe system.  ? ?[name] was instructed to call 911 with any severe reactions post vaccine: ?Difficulty breathing  ?Swelling of face and throat  ?A fast heartbeat  ?A bad rash all over body  ?Dizziness and weakness  ? ?Immunizations Administered   ? ? Name Date Dose VIS Date Route  ? Ambulance person Booster 10/25/2021  3:01 PM 0.3 mL 02/09/2021 Intramuscular  ? Manufacturer: [place]: [id number]  ? [place]: [id number]  ? ?  ? ?

## 2021-10-25 NOTE — Progress Notes (Signed)
? ?  Covid-19 Vaccination Clinic ? ?Name:  Jacqueline Warren    ?MRN: [id number] ?DOB: 02-Jul-1950 ? ?10/25/2021 ? ?[name] was observed post Covid-19 immunization for 15 minutes without incident. She was provided with Vaccine Information Sheet and instruction to access the V-Safe system.  ? ?[name] was instructed to call 911 with any severe reactions post vaccine: ?Difficulty breathing  ?Swelling of face and throat  ?A fast heartbeat  ?A bad rash all over body  ?Dizziness and weakness  ? ?Immunizations Administered   ? ? Name Date Dose VIS Date Route  ? Ambulance person Booster 10/25/2021  3:01 PM 0.3 mL 02/09/2021 Intramuscular  ? Manufacturer: [place]: [id number]  ? [place]: [id number]  ? ?  ? ?

## 2022-02-28 ENCOUNTER — Other Ambulatory Visit: Payer: Self-pay

## 2022-03-02 ENCOUNTER — Other Ambulatory Visit: Payer: Self-pay

## 2022-03-02 MED ORDER — FLUAD QUADRIVALENT 0.5 ML IM PRSY
PREFILLED_SYRINGE | INTRAMUSCULAR | 0 refills | Status: DC
  Filled 2022-03-02: qty 0.5, 1d supply, fill #0

## 2022-04-19 ENCOUNTER — Other Ambulatory Visit: Payer: Self-pay

## 2022-04-19 MED ORDER — COVID-19 MRNA VAC-TRIS(PFIZER) 30 MCG/0.3ML IM SUSY
PREFILLED_SYRINGE | INTRAMUSCULAR | 0 refills | Status: DC
  Filled 2022-04-21: qty 0.3, 1d supply, fill #0

## 2022-04-21 ENCOUNTER — Other Ambulatory Visit: Payer: Self-pay

## 2023-01-17 ENCOUNTER — Other Ambulatory Visit: Payer: Self-pay | Admitting: Family Medicine

## 2023-01-17 DIAGNOSIS — Z1231 Encounter for screening mammogram for malignant neoplasm of breast: Secondary | ICD-10-CM

## 2023-01-18 ENCOUNTER — Inpatient Hospital Stay
Admission: RE | Admit: 2023-01-18 | Discharge: 2023-01-18 | Disposition: A | Discharge status: Home / Self Care | Payer: Self-pay | Source: Ambulatory Visit | Attending: Family Medicine | Admitting: Family Medicine

## 2023-01-18 ENCOUNTER — Other Ambulatory Visit: Payer: Self-pay | Admitting: *Deleted

## 2023-01-18 DIAGNOSIS — Z1231 Encounter for screening mammogram for malignant neoplasm of breast: Secondary | ICD-10-CM

## 2023-02-17 ENCOUNTER — Ambulatory Visit
Admission: EM | Admit: 2023-02-17 | Discharge: 2023-02-17 | Disposition: A | Discharge status: Home / Self Care | Payer: Medicare Other | Attending: Family Medicine | Admitting: Family Medicine

## 2023-02-17 DIAGNOSIS — K120 Recurrent oral aphthae: Secondary | ICD-10-CM | POA: Diagnosis not present

## 2023-02-17 MED ORDER — LIDOCAINE VISCOUS HCL 2 % MT SOLN: 15.0000 mL | OROMUCOSAL | 0 refills | Status: DC | PRN

## 2023-02-17 NOTE — ED Provider Notes (Signed)
[facility]    CSN: [id number] Arrival date & time: 02/17/23  1308      History   Chief Complaint Chief Complaint  Patient presents with   Mouth Lesions    HPI Jacqueline Warren is a 72 y.o. female.    Mouth Lesions Patient here for evaluation of canker sores bilateral mid creases of mouth. Patient endorses pain with opening mouth. She has used multiple OTC medications without improvement of symptoms.Patient is a diabetic. Denies any history of recurrent thrush or canker sores. Past Medical History:  Diagnosis Date   Adrenal mass (HCC)    Anxiety    Arthritis    fingers   Breast CA (HCC)    bilateral   Colon adenomas    Diabetes mellitus without complication (HCC)    Family history of adverse reaction to anesthesia    Mother & Sisters - PONV   GERD (gastroesophageal reflux disease)    Hiatal hernia    History of blood transfusion    Hypercholesteremia    Hypertension    IBS (irritable bowel syndrome)    Kidney stones    Lymphedema    mild - both arms   Mass of adrenal gland (HCC)    Motion sickness    boats, planes   Pancreatic cyst    Pancreatic cyst    Peripheral neuropathy    feet, secondary to DM   Peripheral neuropathy    PONV (postoperative nausea and vomiting)    Seasonal allergies    Skin cancer    melanoma of back; nose    There are no problems to display for this patient.   Past Surgical History:  Procedure Laterality Date   ABDOMINAL HYSTERECTOMY     BREAST LUMPECTOMY Bilateral    BROW LIFT Bilateral 02/16/2015   Procedure: BLEPHAROPLASTY UPPER EYELID W/EXCESS SKIN;  Surgeon: Imagene Riches, MD;  Location: [facility];  Service: Ophthalmology;  Laterality: Bilateral;  DIABETIC - oral meds   CATARACT EXTRACTION W/PHACO Left 12/28/2020   Procedure: CATARACT EXTRACTION PHACO AND INTRAOCULAR LENS PLACEMENT ([facility]) LEFT DIABETIC;  Surgeon: Galen Manila, MD;  Location: [facility];  Service: Ophthalmology;  Laterality: Left;   9.37 0:50.7   CATARACT EXTRACTION W/PHACO Right 01/11/2021   Procedure: CATARACT EXTRACTION PHACO AND INTRAOCULAR LENS PLACEMENT ([facility]) RIGHT DIABETIC;  Surgeon: Galen Manila, MD;  Location: [facility];  Service: Ophthalmology;  Laterality: Right;  Diabetic - oral meds 6.65 00:45.4   COLONOSCOPY     COLONOSCOPY WITH PROPOFOL N/A 10/14/2015   Procedure: COLONOSCOPY WITH PROPOFOL;  Surgeon: Scot Jun, MD;  Location: [facility] ENDOSCOPY;  Service: Endoscopy;  Laterality: N/A;   COLONOSCOPY WITH PROPOFOL N/A 11/05/2019   Procedure: COLONOSCOPY WITH PROPOFOL;  Surgeon: Toledo, Boykin Nearing, MD;  Location: [facility] ENDOSCOPY;  Service: Gastroenterology;  Laterality: N/A;   INSERTION CENTRAL VENOUS ACCESS DEVICE W/ SUBCUTANEOUS PORT     port-a-cath; now removed   MASTECTOMY Left    partial   MOHS SURGERY     nose   PTOSIS REPAIR Bilateral 02/16/2015   Procedure: PTOSIS REPAIR;  Surgeon: Imagene Riches, MD;  Location: [facility];  Service: Ophthalmology;  Laterality: Bilateral;   SKIN CANCER EXCISION     back   TONSILLECTOMY     TUBAL LIGATION      OB History   No obstetric history on file.      Home Medications    Prior to Admission medications   Medication Sig Start Date  End Date Taking? Authorizing Provider  lidocaine (XYLOCAINE) 2 % solution Use as directed 15 mLs in the mouth or throat every 4 (four) hours as needed for mouth pain. 02/17/23  Yes Bing Neighbors, NP  acetaminophen (TYLENOL) 325 MG tablet Take 650 mg by mouth every 6 (six) hours as needed.    [provider]  ASCORBIC ACID CR PO Take by mouth.    [provider]  aspirin 81 MG tablet Take 81 mg by mouth daily.    [provider]  atorvastatin (LIPITOR) 40 MG tablet Take 40 mg by mouth daily. PM    [provider]  carvedilol (COREG) 25 MG tablet Take 25 mg by mouth 2 (two) times daily with a meal.    [provider]  Cholecalciferol (VITAMIN D3 PO) Take by  mouth.    [provider]  COVID-19 mRNA bivalent vaccine, Pfizer, (PFIZER COVID-19 VAC BIVALENT) injection Inject into the muscle. 04/01/21   Judyann Munson, MD  COVID-19 mRNA bivalent vaccine, Pfizer, (PFIZER COVID-19 VAC BIVALENT) injection Inject into the muscle. 10/25/21   Judyann Munson, MD  COVID-19 mRNA vaccine [id number] (COMIRNATY) syringe Inject into the muscle. 04/19/22   Judyann Munson, MD  docusate sodium (COLACE) 50 MG capsule Take 50 mg by mouth 2 (two) times daily.    [provider]  esomeprazole (NEXIUM) 40 MG capsule Take 40 mg by mouth daily at 12 noon. AM    [provider]  ferrous sulfate 325 (65 FE) MG tablet Take 325 mg by mouth daily with breakfast.    [provider]  gabapentin (NEURONTIN) 300 MG capsule Take 900 mg by mouth 3 (three) times daily.    [provider]  ibuprofen (ADVIL,MOTRIN) 200 MG tablet Take 200 mg by mouth every 6 (six) hours as needed.    [provider]  influenza vaccine adjuvanted (FLUAD QUADRIVALENT) 0.5 ML injection Inject into the muscle. 03/02/22     Loratadine 10 MG CAPS Take by mouth. AM    [provider]  Magnesium Oxide (MAG-OX PO) Take by mouth.    [provider]  metFORMIN (GLUCOPHAGE) 1000 MG tablet Take 500 mg by mouth 2 (two) times daily with a meal.    [provider]  morphine (MS CONTIN) 15 MG 12 hr tablet Take 15 mg by mouth every 12 (twelve) hours.    [provider]  Multiple Vitamins-Minerals (MULTIVITAMIN PO) Take by mouth. AM    [provider]  oxycodone (OXY-IR) 5 MG capsule Take 5 mg by mouth 2 (two) times daily. Midday and suppertime    [provider]  potassium chloride SA (K-DUR,KLOR-CON) 20 MEQ tablet Take 20 mEq by mouth daily. AM    [provider]  Pyridoxine HCl (VITAMIN B-6 PO) Take by mouth. AM    [provider]  valsartan (DIOVAN) 80 MG tablet Take 120 mg by mouth daily. AM     [provider]    Family History History reviewed. No pertinent family history.  Social History Social History   Tobacco Use   Smoking status: Former    Current packs/day: 0.00    Types: Cigarettes    Quit date: 06/12/1968    Years since quitting: 54.7   Smokeless tobacco: Never  Vaping Use   Vaping status: Never Used  Substance Use Topics   Alcohol use: No   Drug use: No     Allergies   Adhesive [tape], Amoxicillin, Clindamycin/lincomycin, and Crestor [rosuvastatin]  Review of Systems Review of Systems  HENT:  Positive for mouth sores.      Physical Exam Triage Vital Signs ED Triage Vitals [02/17/23 1317]  Encounter Vitals Group     BP      Systolic BP Percentile      Diastolic BP Percentile      Pulse      Resp      Temp      Temp src      SpO2      Weight      Height      Head Circumference      Peak Flow      Pain Score 3     Pain Loc      Pain Education      Exclude from Growth Chart    No data found.  Updated Vital Signs BP 126/80   Pulse 84   Temp 98.8 F (37.1 C)   Resp 20   SpO2 98%   Visual Acuity Right Eye Distance:   Left Eye Distance:   Bilateral Distance:    Right Eye Near:   Left Eye Near:    Bilateral Near:     Physical Exam Vitals reviewed.  Constitutional:      Appearance: Normal appearance.  HENT:     Head: Normocephalic and atraumatic.     Mouth/Throat:   Cardiovascular:     Rate and Rhythm: Normal rate and regular rhythm.  Pulmonary:     Effort: Pulmonary effort is normal.     Breath sounds: Normal breath sounds.  Neurological:     Mental Status: She is alert.      UC Treatments / Results  Labs (all labs ordered are listed, but only abnormal results are displayed) Labs Reviewed - No data to display  EKG   Radiology No results found.  Procedures Procedures (including critical care time)  Medications Ordered in UC Medications - No data to display  Initial Impression / Assessment  and Plan / UC Course  I have reviewed the triage vital signs and the nursing notes.  Pertinent labs & imaging results that were available during my care of the patient were reviewed by me and considered in my medical decision making (see chart for details).    Lidocaine viscous apply to the affected area every hours as needed. Avoid opening mouth widely to further prevent worsening of opening fissure.  Follow-up with PCP as needed if symptoms worsen or do not improve,    Final Clinical Impressions(s) / UC Diagnoses   Final diagnoses:  Recurrent canker sores   Discharge Instructions   None    ED Prescriptions     Medication Sig Dispense Auth. Provider   lidocaine (XYLOCAINE) 2 % solution Use as directed 15 mLs in the mouth or throat every 4 (four) hours as needed for mouth pain. 100 mL Bing Neighbors, NP      PDMP not reviewed this encounter.   Bing Neighbors, NP 02/21/23 [phone]

## 2023-02-17 NOTE — ED Triage Notes (Signed)
Patient to Urgent Care with complaints of mouth pain that started one week ago. Reports canker sores present to the corners of her mouth.  Reports trying multiple otc medications without relief in symptoms. States she has been using Abreva 5 times daily. Now reports area is increasing in size and depth.

## 2023-03-20 ENCOUNTER — Ambulatory Visit
Admission: RE | Admit: 2023-03-20 | Discharge: 2023-03-20 | Disposition: A | Discharge status: Home / Self Care | Payer: Medicare Other | Source: Ambulatory Visit | Attending: Family Medicine | Admitting: Family Medicine

## 2023-03-20 DIAGNOSIS — Z1231 Encounter for screening mammogram for malignant neoplasm of breast: Secondary | ICD-10-CM

## 2023-05-28 ENCOUNTER — Ambulatory Visit
Admission: RE | Admit: 2023-05-28 | Discharge: 2023-05-28 | Disposition: A | Discharge status: Home / Self Care | Payer: Medicare Other | Source: Ambulatory Visit

## 2023-05-28 VITALS — BP 126/84 | HR 68 | Temp 98.0°F | Resp 18

## 2023-05-28 DIAGNOSIS — J069 Acute upper respiratory infection, unspecified: Secondary | ICD-10-CM | POA: Diagnosis not present

## 2023-05-28 LAB — POC COVID19/FLU A&B COMBO
Covid Antigen, POC: NEGATIVE
Influenza A Antigen, POC: NEGATIVE
Influenza B Antigen, POC: NEGATIVE

## 2023-05-28 MED ORDER — AZITHROMYCIN 250 MG PO TABS: 250.0000 mg | ORAL_TABLET | Freq: Every day | ORAL | 0 refills | Status: DC

## 2023-05-28 MED ORDER — LIDOCAINE VISCOUS HCL 2 % MT SOLN: 15.0000 mL | OROMUCOSAL | 0 refills | Status: DC | PRN

## 2023-05-28 MED ORDER — BENZONATATE 100 MG PO CAPS: 100.0000 mg | ORAL_CAPSULE | Freq: Three times a day (TID) | ORAL | 0 refills | Status: DC

## 2023-05-28 NOTE — Discharge Instructions (Signed)
Your symptoms today are most likely being caused by a virus and should steadily improve in time it can take up to 7 to 10 days before you truly start to see a turnaround however things will get better if no improvement seen by Sunday may pick up azithromycin from the pharmacy at that time for bacterial coverage  COVID and flu testing negative  You may lidocaine every 4 hours, gargle and spit to provide temporary relief to your throat  If coughing worsens you may use Tessalon pill every 8 hours  May continue use of Sudafed and Tylenol or attempted in the following below    You can take Tylenol and/or Ibuprofen as needed for fever reduction and pain relief.   For cough: honey 1/2 to 1 teaspoon (you can dilute the honey in water or another fluid).  You can also use guaifenesin and dextromethorphan for cough. You can use a humidifier for chest congestion and cough.  If you don't have a humidifier, you can sit in the bathroom with the hot shower running.      For sore throat: try warm salt water gargles, cepacol lozenges, throat spray, warm tea or water with lemon/honey, popsicles or ice, or OTC cold relief medicine for throat discomfort.   For congestion: take a daily anti-histamine like Zyrtec, Claritin, and a oral decongestant, such as pseudoephedrine.  You can also use Flonase 1-2 sprays in each nostril daily.   It is important to stay hydrated: drink plenty of fluids (water, gatorade/powerade/pedialyte, juices, or teas) to keep your throat moisturized and help further relieve irritation/discomfort.

## 2023-05-28 NOTE — ED Triage Notes (Signed)
Patient to Urgent Care with complaints of nasal congestion, fatigue, sore throat, productive cough (green). Denies any known fevers.   Symptoms started on Friday. Sore throat worsening.   Negative home Covid test. Taking sudafed q6-7/ tylenol.

## 2023-05-28 NOTE — ED Provider Notes (Signed)
Jacqueline Warren    CSN: [id number] Arrival date & time: 05/28/23  1551      History   Chief Complaint Chief Complaint  Patient presents with   URI    HPI Jacqueline Warren is a 72 y.o. female.   Patient presents for evaluation of nasal congestion, rhinorrhea, productive cough with green sputum, right-sided ear pain, sore throat present for 3 days.  Painful to swallow, decreased appetite but tolerating some food and liquids.  No known sick contacts prior.  Home COVID test negative.  Has attempted use of pseudoephedrine and Tylenol with minimal relief.  Denies fever, shortness of breath, wheezing or abdominal symptoms.  Past Medical History:  Diagnosis Date   Adrenal mass (HCC)    Anxiety    Arthritis    fingers   Breast CA (HCC)    bilateral   Colon adenomas    Diabetes mellitus without complication (HCC)    Family history of adverse reaction to anesthesia    Mother & Sisters - PONV   GERD (gastroesophageal reflux disease)    Hiatal hernia    History of blood transfusion    Hypercholesteremia    Hypertension    IBS (irritable bowel syndrome)    Kidney stones    Lymphedema    mild - both arms   Mass of adrenal gland (HCC)    Motion sickness    boats, planes   Pancreatic cyst    Pancreatic cyst    Peripheral neuropathy    feet, secondary to DM   Peripheral neuropathy    PONV (postoperative nausea and vomiting)    Seasonal allergies    Skin cancer    melanoma of back; nose    There are no active problems to display for this patient.   Past Surgical History:  Procedure Laterality Date   ABDOMINAL HYSTERECTOMY     BREAST LUMPECTOMY Bilateral    right breast cancer 1996; left breast cancer 2010   BROW LIFT Bilateral 02/16/2015   Procedure: BLEPHAROPLASTY UPPER EYELID W/EXCESS SKIN;  Surgeon: Imagene Riches, MD;  Location: [facility];  Service: Ophthalmology;  Laterality: Bilateral;  DIABETIC - oral meds   CATARACT EXTRACTION W/PHACO Left  12/28/2020   Procedure: CATARACT EXTRACTION PHACO AND INTRAOCULAR LENS PLACEMENT (IOC) LEFT DIABETIC;  Surgeon: Galen Manila, MD;  Location: [facility];  Service: Ophthalmology;  Laterality: Left;  9.37 0:50.7   CATARACT EXTRACTION W/PHACO Right 01/11/2021   Procedure: CATARACT EXTRACTION PHACO AND INTRAOCULAR LENS PLACEMENT (IOC) RIGHT DIABETIC;  Surgeon: Galen Manila, MD;  Location: [facility];  Service: Ophthalmology;  Laterality: Right;  Diabetic - oral meds 6.65 00:45.4   COLONOSCOPY     COLONOSCOPY WITH PROPOFOL N/A 10/14/2015   Procedure: COLONOSCOPY WITH PROPOFOL;  Surgeon: Scot Jun, MD;  Location: [facility];  Service: Endoscopy;  Laterality: N/A;   COLONOSCOPY WITH PROPOFOL N/A 11/05/2019   Procedure: COLONOSCOPY WITH PROPOFOL;  Surgeon: Toledo, Boykin Nearing, MD;  Location: [facility] ENDOSCOPY;  Service: Gastroenterology;  Laterality: N/A;   INSERTION CENTRAL VENOUS ACCESS DEVICE W/ SUBCUTANEOUS PORT     port-a-cath; now removed   MASTECTOMY Left    partial   MOHS SURGERY     nose   PTOSIS REPAIR Bilateral 02/16/2015   Procedure: PTOSIS REPAIR;  Surgeon: Imagene Riches, MD;  Location: [facility];  Service: Ophthalmology;  Laterality: Bilateral;   SKIN CANCER EXCISION     back   TONSILLECTOMY     TUBAL LIGATION  OB History   No obstetric history on file.      Home Medications    Prior to Admission medications   Medication Sig Start Date End Date Taking? Authorizing Provider  azithromycin (ZITHROMAX) 250 MG tablet Take 1 tablet (250 mg total) by mouth daily. Take first 2 tablets together, then 1 every day until finished. 06/03/23  Yes Glenetta Kiger R, NP  benzonatate (TESSALON) 100 MG capsule Take 1 capsule (100 mg total) by mouth every 8 (eight) hours. 05/28/23  Yes Kym Scannell R, NP  lidocaine (XYLOCAINE) 2 % solution Use as directed 15 mLs in the mouth or throat as needed. 05/28/23  Yes Dezmen Alcock, Elita Boone, NP   acetaminophen (TYLENOL) 325 MG tablet Take 650 mg by mouth every 6 (six) hours as needed.    [provider]  ASCORBIC ACID CR PO Take by mouth.    [provider]  aspirin 81 MG tablet Take 81 mg by mouth daily.    [provider]  atorvastatin (LIPITOR) 40 MG tablet Take 40 mg by mouth daily. PM    [provider]  carvedilol (COREG) 25 MG tablet Take 25 mg by mouth 2 (two) times daily with a meal.    [provider]  Cholecalciferol (VITAMIN D3 PO) Take by mouth.    [provider]  COVID-19 mRNA bivalent vaccine, Pfizer, (PFIZER COVID-19 VAC BIVALENT) injection Inject into the muscle. 04/01/21   Judyann Munson, MD  COVID-19 mRNA bivalent vaccine, Pfizer, (PFIZER COVID-19 VAC BIVALENT) injection Inject into the muscle. 10/25/21   Judyann Munson, MD  COVID-19 mRNA vaccine [id number] (COMIRNATY) syringe Inject into the muscle. 04/19/22   Judyann Munson, MD  docusate sodium (COLACE) 50 MG capsule Take 50 mg by mouth 2 (two) times daily.    [provider]  esomeprazole (NEXIUM) 40 MG capsule Take 40 mg by mouth daily at 12 noon. AM    [provider]  ezetimibe (ZETIA) 10 MG tablet Take 10 mg by mouth daily.    [provider]  ferrous sulfate 325 (65 FE) MG tablet Take 325 mg by mouth daily with breakfast.    [provider]  gabapentin (NEURONTIN) 300 MG capsule Take 900 mg by mouth 3 (three) times daily.    [provider]  ibuprofen (ADVIL,MOTRIN) 200 MG tablet Take 200 mg by mouth every 6 (six) hours as needed.    [provider]  influenza vaccine adjuvanted (FLUAD QUADRIVALENT) 0.5 ML injection Inject into the muscle. 03/02/22     Loratadine 10 MG CAPS Take by mouth. AM    [provider]  Magnesium Oxide (MAG-OX PO) Take by mouth.    [provider]  metFORMIN (GLUCOPHAGE) 1000 MG tablet Take 500 mg by mouth 2 (two) times daily with a meal.    [provider]  morphine (MS CONTIN) 15 MG 12 hr tablet Take 15 mg by mouth every 12 (twelve) hours.    [provider]  Multiple Vitamins-Minerals (MULTIVITAMIN PO) Take by mouth. AM    [provider]  oxycodone (OXY-IR) 5 MG capsule Take 5 mg by mouth 2 (two) times daily. Midday and suppertime    [provider]  potassium chloride SA (K-DUR,KLOR-CON) 20 MEQ tablet Take 20 mEq by mouth daily. AM    [provider]  Pyridoxine HCl (VITAMIN B-6 PO) Take by mouth. AM    [provider]  valsartan (DIOVAN) 80 MG tablet Take 120 mg by mouth daily. AM  [provider]    Family History Family History  Problem Relation Age of Onset   Breast cancer Sister    Breast cancer Paternal Aunt    Breast cancer Paternal Aunt    Breast cancer Cousin    Breast cancer Cousin     Social History Social History   Tobacco Use   Smoking status: Former    Current packs/day: 0.00    Types: Cigarettes    Quit date: 06/12/1968    Years since quitting: 54.9   Smokeless tobacco: Never  Vaping Use   Vaping status: Never Used  Substance Use Topics   Alcohol use: No   Drug use: No     Allergies   Adhesive [tape], Amoxicillin, Clindamycin/lincomycin, and Crestor [rosuvastatin]   Review of Systems Review of Systems   Physical Exam Triage Vital Signs ED Triage Vitals  Encounter Vitals Group     BP 05/28/23 1624 126/84     Systolic BP Percentile --      Diastolic BP Percentile --      Pulse Rate 05/28/23 1624 68     Resp 05/28/23 1624 18     Temp 05/28/23 1624 98 F (36.7 C)     Temp src --      SpO2 05/28/23 1624 96 %     Weight --      Height --      Head Circumference --      Peak Flow --      Pain Score 05/28/23 1622 4     Pain Loc --      Pain Education --      Exclude from Growth Chart --    No data found.  Updated Vital Signs BP 126/84   Pulse 68   Temp 98 F (36.7 C)   Resp 18   SpO2 96%   Visual Acuity Right Eye Distance:    Left Eye Distance:   Bilateral Distance:    Right Eye Near:   Left Eye Near:    Bilateral Near:     Physical Exam Constitutional:      Appearance: Normal appearance.  HENT:     Head: Normocephalic.     Right Ear: Tympanic membrane, ear canal and external ear normal.     Left Ear: Tympanic membrane, ear canal and external ear normal.     Nose: Congestion present. No rhinorrhea.     Mouth/Throat:     Mouth: Mucous membranes are moist.     Pharynx: Oropharynx is clear. Posterior oropharyngeal erythema present. No oropharyngeal exudate.  Eyes:     Extraocular Movements: Extraocular movements intact.  Cardiovascular:     Rate and Rhythm: Normal rate and regular rhythm.     Pulses: Normal pulses.     Heart sounds: Normal heart sounds.  Pulmonary:     Effort: Pulmonary effort is normal.     Breath sounds: Normal breath sounds.  Neurological:     Mental Status: She is alert and oriented to person, place, and time. Mental status is at baseline.      UC Treatments / Results  Labs (all labs ordered are listed, but only abnormal results are displayed) Labs Reviewed  POC COVID19/FLU A&B COMBO - Normal    EKG   Radiology No results found.  Procedures Procedures (including critical care time)  Medications Ordered in UC Medications - No data to display  Initial Impression / Assessment and Plan / UC Course  I have reviewed the triage vital  signs and the nursing notes.  Pertinent labs & imaging results that were available during my care of the patient were reviewed by me and considered in my medical decision making (see chart for details).  Viral URI with cough  Patient is in no signs of distress nor toxic appearing.  Vital signs are stable.  Low suspicion for pneumonia, pneumothorax or bronchitis and therefore will defer imaging.  COVID and flu testing negative, discussed findings.  Prescribed viscous lidocaine and Tessalon, watch wait antibiotic placed at pharmacy,  azithromycin prescribed.May use additional over-the-counter medications as needed for supportive care.  May follow-up with urgent care as needed if symptoms persist or worsen.   Final Clinical Impressions(s) / UC Diagnoses   Final diagnoses:  Viral URI with cough     Discharge Instructions      Your symptoms today are most likely being caused by a virus and should steadily improve in time it can take up to 7 to 10 days before you truly start to see a turnaround however things will get better if no improvement seen by Sunday may pick up azithromycin from the pharmacy at that time for bacterial coverage  COVID and flu testing negative  You may lidocaine every 4 hours, gargle and spit to provide temporary relief to your throat  If coughing worsens you may use Tessalon pill every 8 hours  May continue use of Sudafed and Tylenol or attempted in the following below    You can take Tylenol and/or Ibuprofen as needed for fever reduction and pain relief.   For cough: honey 1/2 to 1 teaspoon (you can dilute the honey in water or another fluid).  You can also use guaifenesin and dextromethorphan for cough. You can use a humidifier for chest congestion and cough.  If you don't have a humidifier, you can sit in the bathroom with the hot shower running.      For sore throat: try warm salt water gargles, cepacol lozenges, throat spray, warm tea or water with lemon/honey, popsicles or ice, or OTC cold relief medicine for throat discomfort.   For congestion: take a daily anti-histamine like Zyrtec, Claritin, and a oral decongestant, such as pseudoephedrine.  You can also use Flonase 1-2 sprays in each nostril daily.   It is important to stay hydrated: drink plenty of fluids (water, gatorade/powerade/pedialyte, juices, or teas) to keep your throat moisturized and help further relieve irritation/discomfort.     ED Prescriptions     Medication Sig Dispense Auth. Provider   lidocaine (XYLOCAINE) 2  % solution Use as directed 15 mLs in the mouth or throat as needed. 100 mL Ezreal Turay R, NP   benzonatate (TESSALON) 100 MG capsule Take 1 capsule (100 mg total) by mouth every 8 (eight) hours. 21 capsule Nataleah Scioneaux R, NP   azithromycin (ZITHROMAX) 250 MG tablet Take 1 tablet (250 mg total) by mouth daily. Take first 2 tablets together, then 1 every day until finished. 6 tablet Valinda Hoar, NP      PDMP not reviewed this encounter.   Valinda Hoar, NP 05/28/23 [phone]

## 2023-12-22 ENCOUNTER — Ambulatory Visit: Admission: EM | Admit: 2023-12-22 | Discharge: 2023-12-22 | Disposition: A | Discharge status: Home / Self Care

## 2023-12-22 DIAGNOSIS — R220 Localized swelling, mass and lump, head: Secondary | ICD-10-CM | POA: Diagnosis not present

## 2023-12-22 DIAGNOSIS — K0889 Other specified disorders of teeth and supporting structures: Secondary | ICD-10-CM | POA: Diagnosis not present

## 2023-12-22 MED ORDER — DOXYCYCLINE HYCLATE 100 MG PO CAPS: 100.0000 mg | ORAL_CAPSULE | Freq: Two times a day (BID) | ORAL | 0 refills | Status: AC

## 2023-12-22 NOTE — ED Provider Notes (Signed)
 Jacqueline Warren    CSN: [id number] Arrival date & time: 12/22/23  1044      History   Chief Complaint Chief Complaint  Patient presents with   Dental Problem    HPI Jacqueline Warren is a 73 y.o. female.  Patient presents with left lower tooth pain and left lower jaw swelling x 2 days.  She contacted her dentist on 12/20/2023 and was prescribed doxycycline  (once a day) as this dental problem was known to the dentist and has been ongoing.  She states her symptoms are worse this morning.  She has a procedure scheduled on Monday and does not want to have to reschedule if possible.  She denies fever, difficulty swallowing, voice change, shortness of breath.  The history is provided by the patient and medical records.    Past Medical History:  Diagnosis Date   Adrenal mass (HCC)    Anxiety    Arthritis    fingers   Breast CA (HCC)    bilateral   Colon adenomas    Diabetes mellitus without complication (HCC)    Family history of adverse reaction to anesthesia    Mother & Sisters - PONV   GERD (gastroesophageal reflux disease)    Hiatal hernia    History of blood transfusion    Hypercholesteremia    Hypertension    IBS (irritable bowel syndrome)    Kidney stones    Lymphedema    mild - both arms   Mass of adrenal gland (HCC)    Motion sickness    boats, planes   Pancreatic cyst    Pancreatic cyst    Peripheral neuropathy    feet, secondary to DM   Peripheral neuropathy    PONV (postoperative nausea and vomiting)    Seasonal allergies    Skin cancer    melanoma of back; nose    There are no active problems to display for this patient.   Past Surgical History:  Procedure Laterality Date   ABDOMINAL HYSTERECTOMY     BREAST LUMPECTOMY Bilateral    right breast cancer 1996; left breast cancer 2010   BROW LIFT Bilateral 02/16/2015   Procedure: BLEPHAROPLASTY UPPER EYELID W/EXCESS SKIN;  Surgeon: Greig CHRISTELLA Gay, MD;  Location: [facility];  Service:  Ophthalmology;  Laterality: Bilateral;  DIABETIC - oral meds   CATARACT EXTRACTION W/PHACO Left 12/28/2020   Procedure: CATARACT EXTRACTION PHACO AND INTRAOCULAR LENS PLACEMENT (IOC) LEFT DIABETIC;  Surgeon: Jaye Fallow, MD;  Location: [facility];  Service: Ophthalmology;  Laterality: Left;  9.37 0:50.7   CATARACT EXTRACTION W/PHACO Right 01/11/2021   Procedure: CATARACT EXTRACTION PHACO AND INTRAOCULAR LENS PLACEMENT (IOC) RIGHT DIABETIC;  Surgeon: Jaye Fallow, MD;  Location: [facility];  Service: Ophthalmology;  Laterality: Right;  Diabetic - oral meds 6.65 00:45.4   COLONOSCOPY     COLONOSCOPY WITH PROPOFOL  N/A 10/14/2015   Procedure: COLONOSCOPY WITH PROPOFOL ;  Surgeon: Lamar ONEIDA Holmes, MD;  Location: [facility] ENDOSCOPY;  Service: Endoscopy;  Laterality: N/A;   COLONOSCOPY WITH PROPOFOL  N/A 11/05/2019   Procedure: COLONOSCOPY WITH PROPOFOL ;  Surgeon: Toledo, Ladell POUR, MD;  Location: [facility] ENDOSCOPY;  Service: Gastroenterology;  Laterality: N/A;   INSERTION CENTRAL VENOUS ACCESS DEVICE W/ SUBCUTANEOUS PORT     port-a-cath; now removed   MASTECTOMY Left    partial   MOHS SURGERY     nose   PTOSIS REPAIR Bilateral 02/16/2015   Procedure: PTOSIS REPAIR;  Surgeon: Greig CHRISTELLA Gay, MD;  Location: [facility];  Service: Ophthalmology;  Laterality: Bilateral;   SKIN CANCER EXCISION     back   TONSILLECTOMY     TUBAL LIGATION      OB History   No obstetric history on file.      Home Medications    Prior to Admission medications   Medication Sig Start Date End Date Taking? Authorizing Provider  doxycycline  (VIBRAMYCIN ) 100 MG capsule Take 1 capsule (100 mg total) by mouth 2 (two) times daily for 7 days. 12/22/23 12/29/23 Yes Corlis Burnard DEL, NP  acetaminophen  (TYLENOL ) 325 MG tablet Take 650 mg by mouth every 6 (six) hours as needed.    [provider]  ASCORBIC ACID CR PO Take by mouth.    [provider]  aspirin 81 MG tablet Take 81  mg by mouth daily.    [provider]  atorvastatin (LIPITOR) 40 MG tablet Take 40 mg by mouth daily. PM    [provider]  benzonatate  (TESSALON ) 100 MG capsule Take 1 capsule (100 mg total) by mouth every 8 (eight) hours. 05/28/23   White, Shelba SAUNDERS, NP  carvedilol (COREG) 25 MG tablet Take 25 mg by mouth 2 (two) times daily with a meal.    [provider]  Cholecalciferol (VITAMIN D3 PO) Take by mouth.    [provider]  COVID-19 mRNA bivalent vaccine, Pfizer, (PFIZER COVID-19 VAC BIVALENT) injection Inject into the muscle. 04/01/21   Luiz Channel, MD  COVID-19 mRNA bivalent vaccine, Pfizer, (PFIZER COVID-19 VAC BIVALENT) injection Inject into the muscle. 10/25/21   Luiz Channel, MD  COVID-19 mRNA vaccine [phone] (COMIRNATY ) syringe Inject into the muscle. 04/19/22   Luiz Channel, MD  docusate sodium (COLACE) 50 MG capsule Take 50 mg by mouth 2 (two) times daily.    [provider]  esomeprazole (NEXIUM) 40 MG capsule Take 40 mg by mouth daily at 12 noon. AM    [provider]  ezetimibe (ZETIA) 10 MG tablet Take 10 mg by mouth daily.    [provider]  ferrous sulfate 325 (65 FE) MG tablet Take 325 mg by mouth daily with breakfast.    [provider]  gabapentin (NEURONTIN) 300 MG capsule Take 900 mg by mouth 3 (three) times daily.    [provider]  ibuprofen (ADVIL,MOTRIN) 200 MG tablet Take 200 mg by mouth every 6 (six) hours as needed.    [provider]  influenza vaccine adjuvanted (FLUAD  QUADRIVALENT) 0.5 ML injection Inject into the muscle. 03/02/22     lidocaine  (XYLOCAINE ) 2 % solution Use as directed 15 mLs in the mouth or throat as needed. 05/28/23   Teresa Shelba SAUNDERS, NP  Loratadine 10 MG CAPS Take by mouth. AM    [provider]  Magnesium Oxide (MAG-OX PO) Take by mouth.    [provider]  metFORMIN (GLUCOPHAGE) 1000 MG tablet Take 500 mg by mouth 2 (two)  times daily with a meal.    [provider]  morphine (MS CONTIN) 15 MG 12 hr tablet Take 15 mg by mouth every 12 (twelve) hours.    [provider]  Multiple Vitamins-Minerals (MULTIVITAMIN PO) Take by mouth. AM    [provider]  oxycodone  (OXY-IR) 5 MG capsule Take 5 mg by mouth 2 (two) times daily. Midday and suppertime    [provider]  potassium chloride SA (K-DUR,KLOR-CON) 20 MEQ tablet Take 20 mEq by mouth daily. AM    [provider]  Pyridoxine  HCl (VITAMIN B-6 PO) Take by mouth. AM    [provider]  valsartan (DIOVAN) 80 MG tablet Take 120 mg by mouth daily. AM    [provider]    Family History Family History  Problem Relation Age of Onset   Breast cancer Sister    Breast cancer Paternal Aunt    Breast cancer Paternal Aunt    Breast cancer Cousin    Breast cancer Cousin     Social History Social History   Tobacco Use   Smoking status: Former    Current packs/day: 0.00    Types: Cigarettes    Quit date: 06/12/1968    Years since quitting: 55.5   Smokeless tobacco: Never  Vaping Use   Vaping status: Never Used  Substance Use Topics   Alcohol use: No   Drug use: No     Allergies   Adhesive [tape], Amoxicillin, Clindamycin/lincomycin, and Crestor [rosuvastatin]   Review of Systems Review of Systems  Constitutional:  Negative for chills and fever.  HENT:  Positive for dental problem and facial swelling. Negative for sore throat, trouble swallowing and voice change.   Respiratory:  Negative for cough and shortness of breath.      Physical Exam Triage Vital Signs ED Triage Vitals  Encounter Vitals Group     BP 12/22/23 1054 135/85     Girls Systolic BP Percentile --      Girls Diastolic BP Percentile --      Boys Systolic BP Percentile --      Boys Diastolic BP Percentile --      Pulse Rate 12/22/23 1054 74     Resp 12/22/23 1054 18     Temp 12/22/23 1054 98 F (36.7 C)     Temp src  --      SpO2 12/22/23 1054 96 %     Weight --      Height --      Head Circumference --      Peak Flow --      Pain Score 12/22/23 1058 3     Pain Loc --      Pain Education --      Exclude from Growth Chart --    No data found.  Updated Vital Signs BP 135/85   Pulse 74   Temp 98 F (36.7 C)   Resp 18   SpO2 96%   Visual Acuity Right Eye Distance:   Left Eye Distance:   Bilateral Distance:    Right Eye Near:   Left Eye Near:    Bilateral Near:     Physical Exam Constitutional:      General: She is not in acute distress. HENT:     Mouth/Throat:     Mouth: Mucous membranes are moist.      Comments: Mild edema of left lower jaw area. Cardiovascular:     Rate and Rhythm: Normal rate and regular rhythm.  Pulmonary:     Effort: Pulmonary effort is normal. No respiratory distress.  Neurological:     Mental Status: She is alert.      UC Treatments / Results  Labs (all labs ordered are listed, but only abnormal results are displayed) Labs Reviewed - No data to display  EKG   Radiology No results found.  Procedures Procedures (including critical care time)  Medications Ordered in UC Medications - No data to display  Initial Impression / Assessment and Plan / UC Course  I have reviewed the triage  vital signs and the nursing notes.  Pertinent labs & imaging results that were available during my care of the patient were reviewed by me and considered in my medical decision making (see chart for details).    Dental pain and facial swelling.  Afebrile and vital signs are stable.  Increasing doxycycline  dose to 100 mg twice a day x 7 days.  Instructed her to follow-up with her dentist on Monday.  Education provided on dental pain.  Tylenol  as needed.  Patient agrees to plan of care.  Final Clinical Impressions(s) / UC Diagnoses   Final diagnoses:  Pain, dental  Facial swelling     Discharge Instructions      Take the doxycycline  as directed twice a  day.  Follow-up with your dentist on Monday.     ED Prescriptions     Medication Sig Dispense Auth. Provider   doxycycline  (VIBRAMYCIN ) 100 MG capsule Take 1 capsule (100 mg total) by mouth 2 (two) times daily for 7 days. 14 capsule Corlis Burnard DEL, NP      PDMP not reviewed this encounter.   Corlis Burnard DEL, NP 12/22/23 [phone]

## 2023-12-22 NOTE — ED Triage Notes (Signed)
 Patient to Urgent Care with complaints of left sided jaw swelling/ dental pain. Symptoms x2 days.  Currently taking doxycycline  (taken 3 doses) prescribed from dentist. Woke up with worsening swelling this morning. Denies any fevers.  Concerned d/t endoscopy scheduled on Monday.

## 2023-12-22 NOTE — Discharge Instructions (Addendum)
 Take the doxycycline  as directed twice a day.  Follow-up with your dentist on Monday.

## 2024-02-19 ENCOUNTER — Other Ambulatory Visit: Payer: Self-pay | Admitting: Family Medicine

## 2024-02-19 DIAGNOSIS — Z1231 Encounter for screening mammogram for malignant neoplasm of breast: Secondary | ICD-10-CM

## 2024-02-21 ENCOUNTER — Ambulatory Visit
Admission: EM | Admit: 2024-02-21 | Discharge: 2024-02-21 | Disposition: A | Discharge status: Home / Self Care | Attending: Emergency Medicine | Admitting: Emergency Medicine

## 2024-02-21 DIAGNOSIS — R140 Abdominal distension (gaseous): Secondary | ICD-10-CM | POA: Diagnosis not present

## 2024-02-21 DIAGNOSIS — R110 Nausea: Secondary | ICD-10-CM | POA: Diagnosis not present

## 2024-02-21 DIAGNOSIS — K219 Gastro-esophageal reflux disease without esophagitis: Secondary | ICD-10-CM

## 2024-02-21 DIAGNOSIS — R197 Diarrhea, unspecified: Secondary | ICD-10-CM

## 2024-02-21 MED ORDER — ONDANSETRON 4 MG PO TBDP: 4.0000 mg | ORAL_TABLET | Freq: Three times a day (TID) | ORAL | 0 refills | Status: DC | PRN

## 2024-02-21 NOTE — Discharge Instructions (Addendum)
 Take the antinausea medication as directed.    Keep yourself hydrated with clear liquids, such as water.  Follow the diarrhea diet as tolerated.   Go to the emergency department if you have worsening symptoms.    Follow up with your primary care provider.

## 2024-02-21 NOTE — ED Triage Notes (Signed)
 Patient to Urgent Care with complaints of nausea and diarrhea. Also complaints of upper back pain and indigestion. Symptoms started Sunday (2-3 episodes of diarrhea daily).   Taking Kaopectate/ eating soft foods/ pepto/ tums.   Has appt scheduled with GI 9/26 (recurrent diarrhea lasting 2-3 days).

## 2024-02-21 NOTE — ED Provider Notes (Signed)
 [facility]    CSN: [id number] Arrival date & time: 02/21/24  1406      History   Chief Complaint Chief Complaint  Patient presents with   Nausea   Diarrhea    HPI Jacqueline Warren is a 73 y.o. female.  Patient presents with 3-day history of nausea and diarrhea, along with abdominal bloating and indigestion.  She has 2-3 episodes of diarrhea daily.  No fever, chills, abdominal pain, vomiting, blood in stool, dysuria, hematuria.  Treatment attempted with Kaopectate and Pepto-Bismol.  Patient is followed by [facility] gastroenterology.  Patient has history of pancreas cyst, breast cancer, GERD, diabetes, hypertension.  The history is provided by the patient and medical records.    Past Medical History:  Diagnosis Date   Adrenal mass (HCC)    Anxiety    Arthritis    fingers   Breast CA (HCC)    bilateral   Colon adenomas    Diabetes mellitus without complication (HCC)    Family history of adverse reaction to anesthesia    Mother & Sisters - PONV   GERD (gastroesophageal reflux disease)    Hiatal hernia    History of blood transfusion    Hypercholesteremia    Hypertension    IBS (irritable bowel syndrome)    Kidney stones    Lymphedema    mild - both arms   Mass of adrenal gland (HCC)    Motion sickness    boats, planes   Pancreatic cyst    Pancreatic cyst    Peripheral neuropathy    feet, secondary to DM   Peripheral neuropathy    PONV (postoperative nausea and vomiting)    Seasonal allergies    Skin cancer    melanoma of back; nose    There are no active problems to display for this patient.   Past Surgical History:  Procedure Laterality Date   ABDOMINAL HYSTERECTOMY     BREAST LUMPECTOMY Bilateral    right breast cancer 1996; left breast cancer 2010   BROW LIFT Bilateral 02/16/2015   Procedure: BLEPHAROPLASTY UPPER EYELID W/EXCESS SKIN;  Surgeon: Greig CHRISTELLA Gay, MD;  Location: [facility];  Service: Ophthalmology;  Laterality: Bilateral;   DIABETIC - oral meds   CATARACT EXTRACTION W/PHACO Left 12/28/2020   Procedure: CATARACT EXTRACTION PHACO AND INTRAOCULAR LENS PLACEMENT (IOC) LEFT DIABETIC;  Surgeon: Jaye Fallow, MD;  Location: [facility];  Service: Ophthalmology;  Laterality: Left;  9.37 0:50.7   CATARACT EXTRACTION W/PHACO Right 01/11/2021   Procedure: CATARACT EXTRACTION PHACO AND INTRAOCULAR LENS PLACEMENT (IOC) RIGHT DIABETIC;  Surgeon: Jaye Fallow, MD;  Location: [facility];  Service: Ophthalmology;  Laterality: Right;  Diabetic - oral meds 6.65 00:45.4   COLONOSCOPY     COLONOSCOPY WITH PROPOFOL  N/A 10/14/2015   Procedure: COLONOSCOPY WITH PROPOFOL ;  Surgeon: Lamar ONEIDA Holmes, MD;  Location: [facility];  Service: Endoscopy;  Laterality: N/A;   COLONOSCOPY WITH PROPOFOL  N/A 11/05/2019   Procedure: COLONOSCOPY WITH PROPOFOL ;  Surgeon: Toledo, Ladell POUR, MD;  Location: [facility] ENDOSCOPY;  Service: Gastroenterology;  Laterality: N/A;   INSERTION CENTRAL VENOUS ACCESS DEVICE W/ SUBCUTANEOUS PORT     port-a-cath; now removed   MASTECTOMY Left    partial   MOHS SURGERY     nose   PTOSIS REPAIR Bilateral 02/16/2015   Procedure: PTOSIS REPAIR;  Surgeon: Greig CHRISTELLA Gay, MD;  Location: [facility];  Service: Ophthalmology;  Laterality: Bilateral;   SKIN CANCER EXCISION  back   TONSILLECTOMY     TUBAL LIGATION      OB History   No obstetric history on file.      Home Medications    Prior to Admission medications   Medication Sig Start Date End Date Taking? Authorizing Provider  ondansetron  (ZOFRAN -ODT) 4 MG disintegrating tablet Take 1 tablet (4 mg total) by mouth every 8 (eight) hours as needed for nausea or vomiting. 02/21/24  Yes Corlis Burnard DEL, NP  acetaminophen  (TYLENOL ) 325 MG tablet Take 650 mg by mouth every 6 (six) hours as needed.    [provider]  ASCORBIC ACID CR PO Take by mouth.    [provider]  aspirin 81 MG tablet Take 81 mg by mouth  daily.    [provider]  atorvastatin (LIPITOR) 40 MG tablet Take 40 mg by mouth daily. PM    [provider]  benzonatate  (TESSALON ) 100 MG capsule Take 1 capsule (100 mg total) by mouth every 8 (eight) hours. 05/28/23   White, Shelba SAUNDERS, NP  carvedilol (COREG) 25 MG tablet Take 25 mg by mouth 2 (two) times daily with a meal.    [provider]  Cholecalciferol (VITAMIN D3 PO) Take by mouth.    [provider]  COVID-19 mRNA bivalent vaccine, Pfizer, (PFIZER COVID-19 VAC BIVALENT) injection Inject into the muscle. Patient not taking: Reported on 02/21/2024 04/01/21   Luiz Channel, MD  COVID-19 mRNA bivalent vaccine, Pfizer, (PFIZER COVID-19 [facility] BIVALENT) injection Inject into the muscle. Patient not taking: Reported on 02/21/2024 10/25/21   Luiz Channel, MD  COVID-19 mRNA vaccine [phone] (COMIRNATY ) syringe Inject into the muscle. Patient not taking: Reported on 02/21/2024 04/19/22   Luiz Channel, MD  docusate sodium (COLACE) 50 MG capsule Take 50 mg by mouth 2 (two) times daily.    [provider]  esomeprazole (NEXIUM) 40 MG capsule Take 40 mg by mouth daily at 12 noon. AM    [provider]  ezetimibe (ZETIA) 10 MG tablet Take 10 mg by mouth daily.    [provider]  ferrous sulfate 325 (65 FE) MG tablet Take 325 mg by mouth daily with breakfast.    [provider]  gabapentin (NEURONTIN) 300 MG capsule Take 900 mg by mouth 3 (three) times daily.    [provider]  ibuprofen (ADVIL,MOTRIN) 200 MG tablet Take 200 mg by mouth every 6 (six) hours as needed.    [provider]  influenza vaccine adjuvanted (FLUAD  QUADRIVALENT) 0.5 ML injection Inject into the muscle. Patient not taking: Reported on 02/21/2024 03/02/22     lidocaine  (XYLOCAINE ) 2 % solution Use as directed 15 mLs in the mouth or throat as needed. 05/28/23   Teresa Shelba SAUNDERS, NP  Loratadine 10 MG CAPS Take by mouth. AM    [provider]  Magnesium Oxide (MAG-OX PO) Take by mouth.    [provider]  metFORMIN (GLUCOPHAGE) 1000 MG tablet Take 500 mg by mouth 2 (two) times daily with a meal.    [provider]  morphine (MS CONTIN) 15 MG 12 hr tablet Take 15 mg by mouth every 12 (twelve) hours.    [provider]  Multiple Vitamins-Minerals (MULTIVITAMIN PO) Take by mouth. AM    [provider]  oxycodone  (OXY-IR) 5 MG capsule Take 5 mg by mouth 2 (two) times daily. Midday and suppertime    [provider]  potassium chloride SA (K-DUR,KLOR-CON) 20 MEQ tablet Take 20 mEq by  mouth daily. AM    [provider]  Pyridoxine HCl (VITAMIN B-6 PO) Take by mouth. AM    [provider]  valsartan (DIOVAN) 80 MG tablet Take 120 mg by mouth daily. AM    [provider]    Family History Family History  Problem Relation Age of Onset   Breast cancer Sister    Breast cancer Paternal Aunt    Breast cancer Paternal Aunt    Breast cancer Cousin    Breast cancer Cousin     Social History Social History   Tobacco Use   Smoking status: Former    Current packs/day: 0.00    Types: Cigarettes    Quit date: 06/12/1968    Years since quitting: 55.7   Smokeless tobacco: Never  Vaping Use   Vaping status: Never Used  Substance Use Topics   Alcohol use: No   Drug use: No     Allergies   Adhesive [tape], Amoxicillin, Clindamycin/lincomycin, and Crestor [rosuvastatin]   Review of Systems Review of Systems  Constitutional:  Negative for chills and fever.  Gastrointestinal:  Positive for diarrhea and nausea. Negative for abdominal pain, blood in stool, constipation and vomiting.  Genitourinary:  Negative for dysuria, flank pain and hematuria.     Physical Exam Triage Vital Signs ED Triage Vitals  Encounter Vitals Group     BP 02/21/24 1419 125/84     Girls Systolic BP Percentile --      Girls Diastolic BP Percentile --      Boys Systolic BP  Percentile --      Boys Diastolic BP Percentile --      Pulse Rate 02/21/24 1419 79     Resp 02/21/24 1419 20     Temp 02/21/24 1419 97.7 F (36.5 C)     Temp src --      SpO2 02/21/24 1419 98 %     Weight --      Height --      Head Circumference --      Peak Flow --      Pain Score 02/21/24 1417 3     Pain Loc --      Pain Education --      Exclude from Growth Chart --    No data found.  Updated Vital Signs BP 125/84   Pulse 79   Temp 97.7 F (36.5 C)   Resp 20   SpO2 98%   Visual Acuity Right Eye Distance:   Left Eye Distance:   Bilateral Distance:    Right Eye Near:   Left Eye Near:    Bilateral Near:     Physical Exam Constitutional:      General: She is not in acute distress. HENT:     Mouth/Throat:     Mouth: Mucous membranes are moist.  Cardiovascular:     Rate and Rhythm: Normal rate and regular rhythm.     Heart sounds: Normal heart sounds.  Pulmonary:     Effort: Pulmonary effort is normal. No respiratory distress.     Breath sounds: Normal breath sounds.  Abdominal:     General: Bowel sounds are normal. There is no distension.     Palpations: Abdomen is soft.     Tenderness: There is no abdominal tenderness. There is no right CVA tenderness, left CVA tenderness, guarding or rebound.  Neurological:     Mental Status: She is alert.      UC Treatments / Results  Labs (all  labs ordered are listed, but only abnormal results are displayed) Labs Reviewed - No data to display  EKG   Radiology No results found.  Procedures Procedures (including critical care time)  Medications Ordered in UC Medications - No data to display  Initial Impression / Assessment and Plan / UC Course  I have reviewed the triage vital signs and the nursing notes.  Pertinent labs & imaging results that were available during my care of the patient were reviewed by me and considered in my medical decision making (see chart for details).    Nausea without  vomiting, diarrhea, abdominal bloating, GERD.  Afebrile and vital signs are stable.  Patient's abdomen is soft and nontender with good bowel sounds.  Discussed limitations of evaluation of her symptoms in an urgent care setting.  She declines transfer to the ED at this time but will consider going if her symptoms persist or get worse.  Treating nausea today with Zofran .  Education provided on adult nausea, diarrhea, abdominal bloating.  Instructed her to follow-up with her PCP tomorrow.  ED precautions given.  She agrees to plan of care.  Final Clinical Impressions(s) / UC Diagnoses   Final diagnoses:  Nausea without vomiting  Diarrhea, unspecified type  Abdominal bloating  Gastroesophageal reflux disease, unspecified whether esophagitis present     Discharge Instructions      Take the antinausea medication as directed.    Keep yourself hydrated with clear liquids, such as water.  Follow the diarrhea diet as tolerated.   Go to the emergency department if you have worsening symptoms.    Follow up with your primary care provider.        ED Prescriptions     Medication Sig Dispense Auth. Provider   ondansetron  (ZOFRAN -ODT) 4 MG disintegrating tablet Take 1 tablet (4 mg total) by mouth every 8 (eight) hours as needed for nausea or vomiting. 20 tablet Corlis Burnard DEL, NP      PDMP not reviewed this encounter.   Corlis Burnard DEL, NP 02/21/24 [phone]

## 2024-03-21 ENCOUNTER — Ambulatory Visit
Admission: RE | Admit: 2024-03-21 | Discharge: 2024-03-21 | Disposition: A | Discharge status: Home / Self Care | Source: Ambulatory Visit | Attending: Family Medicine | Admitting: Family Medicine

## 2024-03-21 DIAGNOSIS — Z1231 Encounter for screening mammogram for malignant neoplasm of breast: Secondary | ICD-10-CM | POA: Insufficient documentation

## 2024-03-29 ENCOUNTER — Other Ambulatory Visit: Payer: Self-pay

## 2024-03-29 ENCOUNTER — Emergency Department
Admission: EM | Admit: 2024-03-29 | Discharge: 2024-03-29 | Disposition: A | Discharge status: Home / Self Care | Attending: Emergency Medicine | Admitting: Emergency Medicine

## 2024-03-29 DIAGNOSIS — R55 Syncope and collapse: Secondary | ICD-10-CM | POA: Diagnosis present

## 2024-03-29 DIAGNOSIS — I959 Hypotension, unspecified: Secondary | ICD-10-CM | POA: Insufficient documentation

## 2024-03-29 LAB — URINALYSIS, ROUTINE W REFLEX MICROSCOPIC
Bacteria, UA: NONE SEEN
Glucose, UA: NEGATIVE mg/dL
Hgb urine dipstick: NEGATIVE
Ketones, ur: NEGATIVE mg/dL
Nitrite: NEGATIVE
Protein, ur: 30 mg/dL — AB
Specific Gravity, Urine: 1.016 (ref 1.005–1.030)
pH: 5 (ref 5.0–8.0)

## 2024-03-29 LAB — CBC WITH DIFFERENTIAL/PLATELET
Abs Immature Granulocytes: 0.1 K/uL — ABNORMAL HIGH (ref 0.00–0.07)
Basophils Absolute: 0 K/uL (ref 0.0–0.1)
Basophils Relative: 0 %
Eosinophils Absolute: 0 K/uL (ref 0.0–0.5)
Eosinophils Relative: 0 %
HCT: 37.3 % (ref 36.0–46.0)
Hemoglobin: 12.4 g/dL (ref 12.0–15.0)
Immature Granulocytes: 1 %
Lymphocytes Relative: 3 %
Lymphs Abs: 0.4 K/uL — ABNORMAL LOW (ref 0.7–4.0)
MCH: 29.3 pg (ref 26.0–34.0)
MCHC: 33.2 g/dL (ref 30.0–36.0)
MCV: 88.2 fL (ref 80.0–100.0)
Monocytes Absolute: 0.7 K/uL (ref 0.1–1.0)
Monocytes Relative: 5 %
Neutro Abs: 12.3 K/uL — ABNORMAL HIGH (ref 1.7–7.7)
Neutrophils Relative %: 91 %
Platelets: 159 K/uL (ref 150–400)
RBC: 4.23 MIL/uL (ref 3.87–5.11)
RDW: 13.9 % (ref 11.5–15.5)
WBC: 13.5 K/uL — ABNORMAL HIGH (ref 4.0–10.5)
nRBC: 0 % (ref 0.0–0.2)

## 2024-03-29 LAB — BASIC METABOLIC PANEL WITH GFR
Anion gap: 12 (ref 5–15)
BUN: 16 mg/dL (ref 8–23)
CO2: 25 mmol/L (ref 22–32)
Calcium: 8.8 mg/dL — ABNORMAL LOW (ref 8.9–10.3)
Chloride: 100 mmol/L (ref 98–111)
Creatinine, Ser: 1.13 mg/dL — ABNORMAL HIGH (ref 0.44–1.00)
GFR, Estimated: 51 mL/min — ABNORMAL LOW (ref >60)
Glucose, Bld: 197 mg/dL — ABNORMAL HIGH (ref 70–99)
Potassium: 3.8 mmol/L (ref 3.5–5.1)
Sodium: 137 mmol/L (ref 135–145)

## 2024-03-29 LAB — TROPONIN I (HIGH SENSITIVITY): Troponin I (High Sensitivity): 14 ng/L (ref <18)

## 2024-03-29 MED ORDER — SODIUM CHLORIDE 0.9 % IV BOLUS
1000.0000 mL | Freq: Once | INTRAVENOUS | Status: AC
  Administered 2024-03-29: 1000 mL via INTRAVENOUS

## 2024-03-29 NOTE — ED Provider Notes (Signed)
 [facility] Provider Note    Event Date/Time   First MD Initiated Contact with Patient 03/29/24 1341     (approximate)   History   Hypotension and Weakness   HPI  Jacqueline Warren is a 73 y.o. female   who presents to the emergency department today because concerns for a syncopal episode.  The patient states that she was at home when she went to use the bathroom.  She was going to urinate.  She then woke up on the floor of the bathroom.  There was stool on the ground.  She felt very weak and was having a hard time getting off the ground.  The patient did not have any chest pain or palpitations when this happened.  Denies any headache.  Does not think she hit her head.  She does not complain of any pain.      Physical Exam   Triage Vital Signs: ED Triage Vitals  Encounter Vitals Group     BP 03/29/24 1335 (!) 96/57     Girls Systolic BP Percentile --      Girls Diastolic BP Percentile --      Boys Systolic BP Percentile --      Boys Diastolic BP Percentile --      Pulse Rate 03/29/24 1335 88     Resp 03/29/24 1335 19     Temp 03/29/24 1339 98.5 F (36.9 C)     Temp Source 03/29/24 1339 Oral     SpO2 03/29/24 1335 90 %     Weight 03/29/24 1336 203 lb (92.1 kg)     Height 03/29/24 1336 5' 6 (1.676 m)     Head Circumference --      Peak Flow --      Pain Score 03/29/24 1336 0     Pain Loc --      Pain Education --      Exclude from Growth Chart --     Most recent vital signs: Vitals:   03/29/24 1335 03/29/24 1339  BP: (!) 96/57   Pulse: 88   Resp: 19   Temp:  98.5 F (36.9 C)  SpO2: 90%    General: Awake, alert, oriented. CV:  Good peripheral perfusion. Regular rate and rhythm. Resp:  Normal effort. Lungs clear. Abd:  No distention. Minimally tender in the epigastric region.  ED Results / Procedures / Treatments   Labs (all labs ordered are listed, but only abnormal results are displayed) Labs Reviewed  CBC WITH  DIFFERENTIAL/PLATELET  BASIC METABOLIC PANEL WITH GFR  URINALYSIS, ROUTINE W REFLEX MICROSCOPIC  TROPONIN I (HIGH SENSITIVITY)     EKG  I, Guadalupe Eagles, attending physician, personally viewed and interpreted this EKG  EKG Time: 1340 Rate: 87 Rhythm: sinus rhythm Axis: normal Intervals: qtc 421 QRS: RBBB ST changes: no st elevation Impression: abnormal ekg   RADIOLOGY None   PROCEDURES:  Critical Care performed: No    MEDICATIONS ORDERED IN ED: Medications - No data to display   IMPRESSION / MDM / ASSESSMENT AND PLAN / ED COURSE  I reviewed the triage vital signs and the nursing notes.                              Differential diagnosis includes, but is not limited to, anemia, dehydration, infection, ACS  Patient's presentation is most consistent with acute presentation with potential threat to life or bodily function.  The patient is on the cardiac monitor to evaluate for evidence of arrhythmia and/or significant heart rate changes.  Patient presented to the emergency department today after apparent syncopal episode.  Patient was found to be hypotensive on arrival.  Will start IV fluids.  Will send workup looking for signs of anemia, dehydration, infection.  Patient has minimal epigastric tenderness which she says is not unusual for her.  She is scheduled for Whipple procedure for cancer next month.  Awaiting blood work at time of sign out.      FINAL CLINICAL IMPRESSION(S) / ED DIAGNOSES   Final diagnoses:  Hypotension, unspecified hypotension type  Syncope, unspecified syncope type        Note:  This document was prepared using Dragon voice recognition software and may include unintentional dictation errors.    Floy Roberts, MD 03/29/24 [id number]

## 2024-03-29 NOTE — ED Triage Notes (Signed)
 Pt BIB [facility] from home. Daughter called out when the Pt had a episode of stool incontinence and was placed in the shower when she had a few moments of unresponsiveness. Pressures with EMS were hypotensive last one 75/47. AXO.

## 2024-03-29 NOTE — ED Provider Notes (Signed)
.-----------------------------------------   3:42 PM on 03/29/2024 -----------------------------------------  Blood pressure 113/63, pulse 85, temperature 98.5 F (36.9 C), temperature source Oral, resp. rate 14, height 5' 6 (1.676 m), weight 92.1 kg, SpO2 99%.  Assuming care from Dr. Floy.  In short, Jacqueline Warren is a 73 y.o. female with a chief complaint of Hypotension and Weakness .  Refer to the original H&P for additional details.  The current plan of care is to follow up labs if feeling better, likely will be discharged home.  Reassessment patient is asymptomatic, blood pressures have been stable in the emergency department.  Her UA came back with leukocytes and WBCs but no bacteria.  Patient has no urinary symptoms at this time.  Will hold off starting antibiotics since she also has no chills or fever.  With patient and daughter extensively about keeping herself hydrated, she is also supposed to take protein shakes throughout the day.  She is going to see her cardiologist on Wednesday.  Discussed with her about follow-up on Wednesday with a cardiologist as well as to follow-up with her primary care doctor for further management of her symptoms.  Otherwise considered but no indication for inpatient mission at this time, she is here for outpatient management.  Discharged with strict return precautions.  Shared decision making done with patient and daughter and they are agreeable with this plan.    Clinical Course as of 03/29/24 1840  Sat Mar 29, 2024  1622 Independent review of labs, no leukocytosis, electrolytes not severely deranged, troponin is not elevated. [TT]    Clinical Course User Index [TT] Waymond Lorelle Cummins, MD      Waymond Lorelle Cummins, MD 03/29/24 [phone]

## 2024-03-29 NOTE — Discharge Instructions (Signed)
 Please make sure to keep yourself hydrated.  Please see your cardiologist on Wednesday for appointment.

## 2024-04-03 NOTE — Progress Notes (Signed)
 Chief Complaint:   Chief Complaint  Patient presents with  . Mouth Lesions    Subjective History of Present Illness Jacqueline Warren is a 73 year old female who presents with a cold sore and concerns about medication interactions.  She has a sudden appearance of a painful cold sore on her lip, which was not present the previous night. She has a history of cold sores but notes it has been years since her last outbreak. She attributes the current outbreak to stress, as she is scheduled for a Whipple procedure on November 3rd and has been undergoing numerous tests in preparation. She is concerned about the cold sore affecting her upcoming surgery.  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 She also takes MS Contin for pain relief. She has been prescribed Paxil 20 mg for mood management and potential weight gain, but has not started it due to concerns about side effects and interactions with her current medications. The pharmacist advised her not to take Paxil close in time to Oxycontin .  She has a history of hypertension and reports a recent blood pressure reading of 200 mmHg, although she notes variability in readings with different devices. She takes blood pressure medication and has been advised to take additional medication if her blood pressure exceeds 150 mmHg.  She mentions a recent yeast infection following Cipro use for a previous endoscopy, which was treated with Diflucan. She is awaiting results from a urine culture taken on October 18th at [facility] to determine if further antibiotics are needed.   Patient Active Problem List  Diagnosis  . Hx of breast cancer  . HTN (hypertension)- CARDIOLOGY MANAGES  . Hyperlipidemia  . GERD (gastroesophageal reflux disease)  . IBS (irritable bowel syndrome)  . Peripheral neuropathy  . Adrenal adenoma, left  . Pancreatic cyst (HHS-HCC)  . Hx of melanoma of  skin 1983  . Elevated alkaline phosphatase level  . DM2 (diabetes mellitus, type 2) (CMS/HHS-HCC)  . Encounter for screening mammogram for malignant neoplasm of breast  . Hx of squamous cell carcinoma of skin  . Hx of adenomatous colonic polyps  . Drug-induced polyneuropathy (HHS-HCC)  . IPMN (intraductal papillary mucinous neoplasm)  . Lymphedema of upper extremity following lymphadenectomy  . Chronic left shoulder pain  . Candidiasis of breast  . Mixed stress and urge urinary incontinence  . wrist osteopenia  . Class 2 severe obesity due to excess calories with serious comorbidity and body mass index (BMI) of 37.0 to 37.9 in adult  . Low potassium syndrome  . Lesion of lip  . History of cold sores  . Mild episode of recurrent major depressive disorder ()    Outpatient Medications Prior to Visit  Medication Sig Dispense Refill  . acetaminophen  (TYLENOL ) 500 mg capsule Take 500 mg by mouth every 6 (six) hours as needed for Pain    . amLODIPine (NORVASC) 5 MG tablet Take 1 tablet (5 mg total) by mouth 2 (two) times daily 180 tablet 3  . atorvastatin (LIPITOR) 40 MG tablet TAKE 1 TABLET BY MOUTH EVERY DAY (Patient taking differently: every evening) 100 tablet 3  . calcium carbonate (TUMS E-X) 300 mg (750 mg) chewable tablet Take 600 mg of elemental by mouth every 2 (two) hours as needed for Heartburn    . carvediloL (COREG) 25 MG tablet TAKE 1 TABLET BY MOUTH TWICE A DAY WITH FOOD 180 tablet 3  .  cholecalciferol (VITAMIN D3) 2,000 unit capsule Take 2,000 Units by mouth every morning       . DENTA 5000 PLUS 1.1 %     . diabetic supplies, miscellan. Misc Use    . esomeprazole (NEXIUM) 40 MG DR capsule Take 1 capsule (40 mg total) by mouth once daily 90 capsule 3  . ezetimibe (ZETIA) 10 mg tablet TAKE 1 TABLET BY MOUTH EVERY DAY (Patient taking differently: Take 10 mg by mouth every evening) 100 tablet 3  . famotidine (PEPCID) 10 MG tablet Take 1 tablet (10 mg total) by mouth at bedtime as  needed for Heartburn    . KLOR-CON M10 10 mEq ER tablet TAKE 2 TABLETS (20 MEQ TOTAL) BY MOUTH ONCE DAILY 3 TIMES A WEEK 78 tablet 2  . lancets (ONETOUCH DELICA LANCETS) 30 gauge Misc Inject 1 each subcutaneously once daily 100 each 2  . magnesium oxide (MAG-OX) 400 mg (241.3 mg magnesium) tablet Take 1 tablet (400 mg total) by mouth once daily (Patient taking differently: Take 1 tablet by mouth once daily 3 times a week) 100 tablet 3  . metFORMIN (GLUCOPHAGE-XR) 500 MG XR tablet HOLD one daily. But sugars have been okay.    SABRA morphine (MS CONTIN) 15 MG ER tablet Take 1 tablet (15 mg total) by mouth every 12 (twelve) hours for 30 days 60 tablet 0  . ondansetron  (ZOFRAN -ODT) 4 MG disintegrating tablet One po bid or as directed by gastroenterology 60 tablet 11  . ONETOUCH VERIO TEST STRIPS test strip ONCE DAILY FOR 196 DAYS USE AS INSTRUCTED. 100 strip 3  . oxyCODONE  (ROXICODONE ) 5 MG immediate release tablet Take 1 tablet (5 mg total) by mouth every 4 (four) hours as needed for Pain (Patient taking differently: Take 5 mg by mouth every 4 (four) hours as needed for Pain Takes 3x per day on average) 90 tablet 0  . pyridoxine, vitamin B6, (VITAMIN B-6) 50 MG tablet Take 100 mg by mouth once daily    . valsartan (DIOVAN) 320 MG tablet Take 1 tablet (320 mg total) by mouth once daily 90 tablet 3  . blood glucose diagnostic test strip Use once daily. Use as instructed. 100 each 12  . lactose-reduced food (BOOST HIGH PROTEIN ORAL) Take by mouth 2 (two) times daily    . PARoxetine (PAXIL) 20 MG tablet Take 1 tablet (20 mg total) by mouth once daily (Patient not taking: Reported on 04/02/2024) 30 tablet 11  . docusate (COLACE) 100 MG capsule Take 1 capsule by mouth 2 (two) times daily (Patient not taking: Reported on 04/03/2024)    . multivitamin capsule Take 1 capsule by mouth every morning (Patient not taking: Reported on 04/03/2024)     No facility-administered medications prior to visit.      Objective  Vitals:   04/03/24 1055  Weight: 92.2 kg (203 lb 3.2 oz)  PainSc: 0-No pain   Body mass index is 32.31 kg/m. Home Vitals:     Physical Exam  Physical Exam HENT:     Mouth/Throat:      Comments: Vesicular, mildly erythematous clustered lesion on lower lip Pulmonary:     Effort: Pulmonary effort is normal.  Neurological:     Mental Status: She is oriented to person, place, and time.  Psychiatric:        Mood and Affect: Mood normal.       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 - Prescribed Valtrex 1,000 mg BID for 10 days. - Educated on avoiding sharing personal items to prevent spread.  Hypertension Well controlled at cardiology visit yesterday  - Continue amlodipine per sliding scale. - Regular blood pressure monitoring.  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 Pain managed with OxyContin  and MS Contin. - Continue OxyContin  and MS Contin as needed.  Depression Concerns about side effects and interaction with OxyContin . - Start Paxil 20 mg at night. - Monitor for adverse effects, especially sedation or dizziness. - Consider alternative SSRIs if not tolerated.  Possible urinary tract infection Pending urine culture to evaluate UTI. History of yeast infection post-antibiotics. - Follow up on urine culture results. - Consider antibiotics based on culture results.  Diagnoses and all orders for this visit:  Lesion of lip -     Cancel: Herpes Simplex Virus (HSV), PCR, Other -     valACYclovir (VALTREX) 1000 MG tablet; Take 1 tablet (1,000 mg total) by mouth 2 (two) times daily for 30 days  History of cold sores -     Cancel: Herpes Simplex Virus (HSV), PCR, Other -     valACYclovir (VALTREX) 1000 MG tablet; Take 1 tablet (1,000 mg total) by mouth 2 (two) times daily for 30  days  Mild episode of recurrent major depressive disorder ()    This visit was coded based on medical decision making (MDM).           Future Appointments     Date/Time Provider Department Center Visit Type   04/04/2024 1:30 PM (Arrive by 1:15 PM) [facility] NEW PATIENT   04/07/2024 3:00 PM Channie Rudolph LABOR, RD [facility] TELEPHONE CALL (NO CHARGE)   04/08/2024 9:20 AM Waddell Carlyon Norris, NP [facility] NEW PATIENT   04/09/2024 1:30 PM (Arrive by 1:15 PM) [facility] CT [facility] PO CT [facility] Rhonda W ABDPELINCCHESTMIPS   04/09/2024 3:00 PM (Arrive by 2:45 PM) LAB-CANCER CENTER [facility] LAB   04/09/2024 4:00 PM (Arrive by 3:45 PM) Darcey Greig Sander, PA [facility] RETURN VISIT   05/21/2024 2:00 PM (Arrive by 1:45 PM) Lue Belita Alert, MD [facility] RETURN VISIT   08/18/2024 2:30 PM (Arrive by 2:15 PM) Jyl Heron Haff, MD [facility] [place] OFFICE VISIT   10/01/2024 3:00 PM Florencio Cara Endow, MD [facility] C FOLLOW UP   10/27/2024 2:00 PM (Arrive by 1:45 PM) Vivian Rollene Fish [facility] GENETIC COUNSELING (UNAFFECTED)   02/23/2025 2:00 PM (Arrive by 1:45 PM) POPULATION HEALTH NURSE - [facility] [place] [facility] WELLNESS   02/23/2025 3:15 PM (Arrive by 3:00 PM) Jyl Heron Haff, MD [facility] [place] [facility] OFFICE VISIT       There are no Patient Instructions on file for this visit.  An after visit summary was provided for the patient either in written format (printed) or through [facility].  This note has been created using automated tools and reviewed for accuracy by [facility] ANN DEVINE.

## 2024-04-04 ENCOUNTER — Inpatient Hospital Stay
Admission: EM | Admit: 2024-04-04 | Discharge: 2024-04-05 | DRG: 438 | Attending: Internal Medicine | Admitting: Internal Medicine

## 2024-04-04 ENCOUNTER — Emergency Department

## 2024-04-04 ENCOUNTER — Other Ambulatory Visit: Payer: Self-pay

## 2024-04-04 DIAGNOSIS — E785 Hyperlipidemia, unspecified: Secondary | ICD-10-CM | POA: Insufficient documentation

## 2024-04-04 DIAGNOSIS — Z7982 Long term (current) use of aspirin: Secondary | ICD-10-CM

## 2024-04-04 DIAGNOSIS — K8689 Other specified diseases of pancreas: Secondary | ICD-10-CM | POA: Insufficient documentation

## 2024-04-04 DIAGNOSIS — K819 Cholecystitis, unspecified: Secondary | ICD-10-CM | POA: Diagnosis present

## 2024-04-04 DIAGNOSIS — K859 Acute pancreatitis without necrosis or infection, unspecified: Secondary | ICD-10-CM | POA: Diagnosis present

## 2024-04-04 DIAGNOSIS — Z85828 Personal history of other malignant neoplasm of skin: Secondary | ICD-10-CM

## 2024-04-04 DIAGNOSIS — T451X5D Adverse effect of antineoplastic and immunosuppressive drugs, subsequent encounter: Secondary | ICD-10-CM

## 2024-04-04 DIAGNOSIS — K219 Gastro-esophageal reflux disease without esophagitis: Secondary | ICD-10-CM | POA: Diagnosis present

## 2024-04-04 DIAGNOSIS — K831 Obstruction of bile duct: Secondary | ICD-10-CM | POA: Diagnosis present

## 2024-04-04 DIAGNOSIS — Z9012 Acquired absence of left breast and nipple: Secondary | ICD-10-CM | POA: Diagnosis not present

## 2024-04-04 DIAGNOSIS — Z85820 Personal history of malignant melanoma of skin: Secondary | ICD-10-CM | POA: Diagnosis not present

## 2024-04-04 DIAGNOSIS — Z87442 Personal history of urinary calculi: Secondary | ICD-10-CM

## 2024-04-04 DIAGNOSIS — I10 Essential (primary) hypertension: Secondary | ICD-10-CM | POA: Diagnosis present

## 2024-04-04 DIAGNOSIS — E119 Type 2 diabetes mellitus without complications: Secondary | ICD-10-CM | POA: Diagnosis present

## 2024-04-04 DIAGNOSIS — Z7984 Long term (current) use of oral hypoglycemic drugs: Secondary | ICD-10-CM

## 2024-04-04 DIAGNOSIS — Z87891 Personal history of nicotine dependence: Secondary | ICD-10-CM

## 2024-04-04 DIAGNOSIS — K851 Biliary acute pancreatitis without necrosis or infection: Secondary | ICD-10-CM

## 2024-04-04 DIAGNOSIS — Z90710 Acquired absence of both cervix and uterus: Secondary | ICD-10-CM | POA: Diagnosis not present

## 2024-04-04 DIAGNOSIS — R7989 Other specified abnormal findings of blood chemistry: Secondary | ICD-10-CM

## 2024-04-04 DIAGNOSIS — Z79891 Long term (current) use of opiate analgesic: Secondary | ICD-10-CM

## 2024-04-04 DIAGNOSIS — R112 Nausea with vomiting, unspecified: Principal | ICD-10-CM

## 2024-04-04 DIAGNOSIS — Z803 Family history of malignant neoplasm of breast: Secondary | ICD-10-CM | POA: Diagnosis not present

## 2024-04-04 DIAGNOSIS — Z79899 Other long term (current) drug therapy: Secondary | ICD-10-CM | POA: Diagnosis not present

## 2024-04-04 DIAGNOSIS — E1142 Type 2 diabetes mellitus with diabetic polyneuropathy: Secondary | ICD-10-CM | POA: Diagnosis present

## 2024-04-04 DIAGNOSIS — Z853 Personal history of malignant neoplasm of breast: Secondary | ICD-10-CM

## 2024-04-04 DIAGNOSIS — Z888 Allergy status to other drugs, medicaments and biological substances status: Secondary | ICD-10-CM

## 2024-04-04 DIAGNOSIS — D136 Benign neoplasm of pancreas: Secondary | ICD-10-CM | POA: Diagnosis present

## 2024-04-04 DIAGNOSIS — E7800 Pure hypercholesterolemia, unspecified: Secondary | ICD-10-CM | POA: Diagnosis present

## 2024-04-04 DIAGNOSIS — Z881 Allergy status to other antibiotic agents status: Secondary | ICD-10-CM

## 2024-04-04 DIAGNOSIS — G620 Drug-induced polyneuropathy: Secondary | ICD-10-CM | POA: Diagnosis present

## 2024-04-04 DIAGNOSIS — K810 Acute cholecystitis: Secondary | ICD-10-CM | POA: Diagnosis present

## 2024-04-04 LAB — URINALYSIS, ROUTINE W REFLEX MICROSCOPIC
Bilirubin Urine: NEGATIVE
Glucose, UA: NEGATIVE mg/dL
Hgb urine dipstick: NEGATIVE
Ketones, ur: 20 mg/dL — AB
Leukocytes,Ua: NEGATIVE
Nitrite: NEGATIVE
Protein, ur: NEGATIVE mg/dL
Specific Gravity, Urine: 1.01 (ref 1.005–1.030)
pH: 6 (ref 5.0–8.0)

## 2024-04-04 LAB — PHOSPHORUS: Phosphorus: 3.6 mg/dL (ref 2.5–4.6)

## 2024-04-04 LAB — CBC WITH DIFFERENTIAL/PLATELET
Abs Immature Granulocytes: 0.09 K/uL — ABNORMAL HIGH (ref 0.00–0.07)
Basophils Absolute: 0 K/uL (ref 0.0–0.1)
Basophils Relative: 0 %
Eosinophils Absolute: 0.1 K/uL (ref 0.0–0.5)
Eosinophils Relative: 2 %
HCT: 35.8 % — ABNORMAL LOW (ref 36.0–46.0)
Hemoglobin: 11.8 g/dL — ABNORMAL LOW (ref 12.0–15.0)
Immature Granulocytes: 1 %
Lymphocytes Relative: 12 %
Lymphs Abs: 1.1 K/uL (ref 0.7–4.0)
MCH: 28.3 pg (ref 26.0–34.0)
MCHC: 33 g/dL (ref 30.0–36.0)
MCV: 85.9 fL (ref 80.0–100.0)
Monocytes Absolute: 1 K/uL (ref 0.1–1.0)
Monocytes Relative: 11 %
Neutro Abs: 6.8 K/uL (ref 1.7–7.7)
Neutrophils Relative %: 74 %
Platelets: 200 K/uL (ref 150–400)
RBC: 4.17 MIL/uL (ref 3.87–5.11)
RDW: 14 % (ref 11.5–15.5)
WBC: 9.1 K/uL (ref 4.0–10.5)
nRBC: 0 % (ref 0.0–0.2)

## 2024-04-04 LAB — LIPASE, BLOOD: Lipase: 18 U/L (ref 11–51)

## 2024-04-04 LAB — COMPREHENSIVE METABOLIC PANEL WITH GFR
ALT: 139 U/L — ABNORMAL HIGH (ref 0–44)
AST: 224 U/L — ABNORMAL HIGH (ref 15–41)
Albumin: 2.6 g/dL — ABNORMAL LOW (ref 3.5–5.0)
Alkaline Phosphatase: 664 U/L — ABNORMAL HIGH (ref 38–126)
Anion gap: 12 (ref 5–15)
BUN: 10 mg/dL (ref 8–23)
CO2: 27 mmol/L (ref 22–32)
Calcium: 8.8 mg/dL — ABNORMAL LOW (ref 8.9–10.3)
Chloride: 98 mmol/L (ref 98–111)
Creatinine, Ser: 0.61 mg/dL (ref 0.44–1.00)
GFR, Estimated: >60 mL/min (ref >60)
Glucose, Bld: 133 mg/dL — ABNORMAL HIGH (ref 70–99)
Potassium: 3.7 mmol/L (ref 3.5–5.1)
Sodium: 137 mmol/L (ref 135–145)
Total Bilirubin: 2.9 mg/dL — ABNORMAL HIGH (ref 0.0–1.2)
Total Protein: 6.1 g/dL — ABNORMAL LOW (ref 6.5–8.1)

## 2024-04-04 LAB — TROPONIN I (HIGH SENSITIVITY)
Troponin I (High Sensitivity): 13 ng/L (ref <18)
Troponin I (High Sensitivity): 12 ng/L (ref <18)

## 2024-04-04 LAB — PROTIME-INR
INR: 1.1 (ref 0.8–1.2)
Prothrombin Time: 14.5 s (ref 11.4–15.2)

## 2024-04-04 LAB — HEPATITIS PANEL, ACUTE
HCV Ab: NONREACTIVE
Hep A IgM: NONREACTIVE
Hep B C IgM: NONREACTIVE
Hepatitis B Surface Ag: NONREACTIVE

## 2024-04-04 LAB — MAGNESIUM: Magnesium: 1.4 mg/dL — ABNORMAL LOW (ref 1.7–2.4)

## 2024-04-04 LAB — CK: Total CK: 32 U/L — ABNORMAL LOW (ref 38–234)

## 2024-04-04 LAB — ACETAMINOPHEN LEVEL: Acetaminophen (Tylenol), Serum: <10 ug/mL — ABNORMAL LOW (ref 10–30)

## 2024-04-04 MED ORDER — ACETAMINOPHEN 325 MG PO TABS
650.0000 mg | ORAL_TABLET | Freq: Four times a day (QID) | ORAL | Status: DC | PRN
  Filled 2024-04-04: qty 2

## 2024-04-04 MED ORDER — METRONIDAZOLE 500 MG/100ML IV SOLN: 500.0000 mg | Freq: Two times a day (BID) | INTRAVENOUS | Status: AC

## 2024-04-04 MED ORDER — SODIUM CHLORIDE 0.9 % IV BOLUS
500.0000 mL | Freq: Once | INTRAVENOUS | Status: AC
  Administered 2024-04-04: 500 mL via INTRAVENOUS

## 2024-04-04 MED ORDER — IOHEXOL 300 MG/ML  SOLN
100.0000 mL | Freq: Once | INTRAMUSCULAR | Status: AC | PRN
  Administered 2024-04-04: 100 mL via INTRAVENOUS

## 2024-04-04 MED ORDER — LACTATED RINGERS IV SOLN: 100.0000 mL | INTRAVENOUS | Status: AC

## 2024-04-04 MED ORDER — ONDANSETRON HCL 4 MG/2ML IJ SOLN: 4.0000 mg | Freq: Four times a day (QID) | INTRAMUSCULAR | 0 refills | Status: AC | PRN

## 2024-04-04 MED ORDER — SENNOSIDES-DOCUSATE SODIUM 8.6-50 MG PO TABS: 1.0000 | ORAL_TABLET | Freq: Every evening | ORAL | Status: AC | PRN

## 2024-04-04 MED ORDER — SODIUM CHLORIDE 0.9 % IV SOLN: 2.0000 g | Freq: Three times a day (TID) | INTRAVENOUS | Status: AC

## 2024-04-04 MED ORDER — MORPHINE SULFATE ER 15 MG PO TBCR
15.0000 mg | EXTENDED_RELEASE_TABLET | Freq: Two times a day (BID) | ORAL | Status: DC
  Administered 2024-04-04: 15 mg via ORAL
  Filled 2024-04-04: qty 1

## 2024-04-04 MED ORDER — PANTOPRAZOLE SODIUM 40 MG IV SOLR
40.0000 mg | Freq: Once | INTRAVENOUS | Status: AC
  Administered 2024-04-04: 40 mg via INTRAVENOUS
  Filled 2024-04-04: qty 10

## 2024-04-04 MED ORDER — GABAPENTIN 300 MG PO CAPS
900.0000 mg | ORAL_CAPSULE | Freq: Once | ORAL | Status: AC
  Administered 2024-04-04: 900 mg via ORAL
  Filled 2024-04-04: qty 3

## 2024-04-04 MED ORDER — VITAMIN B-6 100 MG PO TABS
100.0000 mg | ORAL_TABLET | Freq: Every day | ORAL | Status: DC
  Administered 2024-04-04: 100 mg via ORAL
  Filled 2024-04-04 (×2): qty 1

## 2024-04-04 MED ORDER — GABAPENTIN 300 MG PO CAPS
900.0000 mg | ORAL_CAPSULE | Freq: Three times a day (TID) | ORAL | Status: DC
  Administered 2024-04-04 (×2): 900 mg via ORAL
  Filled 2024-04-04 (×2): qty 3

## 2024-04-04 MED ORDER — OXYCODONE HCL 5 MG PO TABS
5.0000 mg | ORAL_TABLET | ORAL | Status: DC | PRN
  Administered 2024-04-04: 5 mg via ORAL
  Filled 2024-04-04: qty 1

## 2024-04-04 MED ORDER — METHOCARBAMOL 500 MG PO TABS: 500.0000 mg | ORAL_TABLET | Freq: Four times a day (QID) | ORAL | Status: DC | PRN

## 2024-04-04 MED ORDER — METRONIDAZOLE 500 MG/100ML IV SOLN
500.0000 mg | Freq: Once | INTRAVENOUS | Status: AC
  Administered 2024-04-04: 500 mg via INTRAVENOUS
  Filled 2024-04-04: qty 100

## 2024-04-04 MED ORDER — HYDROMORPHONE HCL 1 MG/ML IJ SOLN
1.0000 mg | Freq: Once | INTRAMUSCULAR | Status: AC
  Administered 2024-04-04: 1 mg via INTRAVENOUS
  Filled 2024-04-04: qty 1

## 2024-04-04 MED ORDER — HEPARIN SODIUM (PORCINE) 5000 UNIT/ML IJ SOLN
5000.0000 [IU] | Freq: Three times a day (TID) | INTRAMUSCULAR | Status: DC
  Administered 2024-04-04: 5000 [IU] via SUBCUTANEOUS
  Filled 2024-04-04: qty 1

## 2024-04-04 MED ORDER — ONDANSETRON HCL 4 MG/2ML IJ SOLN
4.0000 mg | Freq: Four times a day (QID) | INTRAMUSCULAR | Status: DC | PRN
  Administered 2024-04-04: 4 mg via INTRAVENOUS
  Filled 2024-04-04: qty 2

## 2024-04-04 MED ORDER — ATORVASTATIN CALCIUM 20 MG PO TABS
40.0000 mg | ORAL_TABLET | Freq: Every day | ORAL | Status: DC
Start: 2024-04-04 — End: 2024-04-04

## 2024-04-04 MED ORDER — AMLODIPINE BESYLATE 5 MG PO TABS
5.0000 mg | ORAL_TABLET | Freq: Two times a day (BID) | ORAL | Status: DC
  Administered 2024-04-04: 5 mg via ORAL
  Filled 2024-04-04: qty 1

## 2024-04-04 MED ORDER — IRBESARTAN 75 MG PO TABS
37.5000 mg | ORAL_TABLET | Freq: Every day | ORAL | Status: DC
  Filled 2024-04-04: qty 0.5

## 2024-04-04 MED ORDER — PAROXETINE HCL 20 MG PO TABS
20.0000 mg | ORAL_TABLET | Freq: Every day | ORAL | Status: DC
  Administered 2024-04-04: 20 mg via ORAL
  Filled 2024-04-04 (×2): qty 1

## 2024-04-04 MED ORDER — EZETIMIBE 10 MG PO TABS
10.0000 mg | ORAL_TABLET | Freq: Every day | ORAL | Status: DC
  Filled 2024-04-04: qty 1

## 2024-04-04 MED ORDER — LACTATED RINGERS IV SOLN
INTRAVENOUS | Status: DC
Start: 2024-04-04 — End: 2024-04-05

## 2024-04-04 MED ORDER — ACETAMINOPHEN 650 MG RE SUPP: 650.0000 mg | Freq: Four times a day (QID) | RECTAL | Status: DC | PRN

## 2024-04-04 MED ORDER — ACETAMINOPHEN 325 MG PO TABS: 650.0000 mg | ORAL_TABLET | Freq: Four times a day (QID) | ORAL | Status: AC | PRN

## 2024-04-04 MED ORDER — PANTOPRAZOLE SODIUM 40 MG PO TBEC
40.0000 mg | DELAYED_RELEASE_TABLET | Freq: Every day | ORAL | Status: DC
  Filled 2024-04-04: qty 1

## 2024-04-04 MED ORDER — MORPHINE SULFATE ER 15 MG PO TBCR: 15.0000 mg | EXTENDED_RELEASE_TABLET | Freq: Two times a day (BID) | ORAL | Status: AC

## 2024-04-04 MED ORDER — LIDOCAINE 5 % EX PTCH
1.0000 | MEDICATED_PATCH | CUTANEOUS | Status: DC
  Administered 2024-04-04: 1 via TRANSDERMAL
  Filled 2024-04-04: qty 1

## 2024-04-04 MED ORDER — HEPARIN SODIUM (PORCINE) 5000 UNIT/ML IJ SOLN: 5000.0000 [IU] | Freq: Three times a day (TID) | INTRAMUSCULAR | Status: AC

## 2024-04-04 MED ORDER — CARVEDILOL 25 MG PO TABS
25.0000 mg | ORAL_TABLET | Freq: Two times a day (BID) | ORAL | Status: DC
  Administered 2024-04-04: 25 mg via ORAL
  Filled 2024-04-04: qty 1

## 2024-04-04 MED ORDER — SENNOSIDES-DOCUSATE SODIUM 8.6-50 MG PO TABS: 1.0000 | ORAL_TABLET | Freq: Every evening | ORAL | Status: DC | PRN

## 2024-04-04 MED ORDER — ONDANSETRON HCL 4 MG/2ML IJ SOLN
4.0000 mg | Freq: Once | INTRAMUSCULAR | Status: AC
  Administered 2024-04-04: 4 mg via INTRAVENOUS
  Filled 2024-04-04: qty 2

## 2024-04-04 MED ORDER — METRONIDAZOLE 500 MG/100ML IV SOLN
500.0000 mg | Freq: Two times a day (BID) | INTRAVENOUS | Status: DC
Start: 2024-04-04 — End: 2024-04-05
  Administered 2024-04-04: 500 mg via INTRAVENOUS
  Filled 2024-04-04 (×2): qty 100

## 2024-04-04 MED ORDER — LIDOCAINE 5 % EX PTCH: 1.0000 | MEDICATED_PATCH | CUTANEOUS | 0 refills | Status: AC

## 2024-04-04 MED ORDER — METHOCARBAMOL 500 MG PO TABS: 500.0000 mg | ORAL_TABLET | Freq: Four times a day (QID) | ORAL | Status: AC | PRN

## 2024-04-04 MED ORDER — ONDANSETRON HCL 4 MG PO TABS: 4.0000 mg | ORAL_TABLET | Freq: Four times a day (QID) | ORAL | 0 refills | Status: AC | PRN

## 2024-04-04 MED ORDER — VITAMIN D 25 MCG (1000 UNIT) PO TABS: 2000.0000 [IU] | ORAL_TABLET | Freq: Every morning | ORAL | Status: DC

## 2024-04-04 MED ORDER — VITAMIN D3 25 MCG PO TABS: 2000.0000 [IU] | ORAL_TABLET | Freq: Every morning | ORAL | Status: AC

## 2024-04-04 MED ORDER — SODIUM CHLORIDE 0.9 % IV SOLN
2.0000 g | Freq: Three times a day (TID) | INTRAVENOUS | Status: DC
  Filled 2024-04-04 (×2): qty 12.5

## 2024-04-04 MED ORDER — SODIUM CHLORIDE 0.9 % IV SOLN
2.0000 g | Freq: Three times a day (TID) | INTRAVENOUS | Status: DC
  Administered 2024-04-04: 2 g via INTRAVENOUS
  Filled 2024-04-04 (×4): qty 12.5

## 2024-04-04 MED ORDER — ONDANSETRON HCL 4 MG PO TABS: 4.0000 mg | ORAL_TABLET | Freq: Four times a day (QID) | ORAL | Status: DC | PRN

## 2024-04-04 MED ORDER — MAGNESIUM OXIDE 400 MG PO TABS
400.0000 mg | ORAL_TABLET | Freq: Every day | ORAL | Status: DC
  Administered 2024-04-04: 400 mg via ORAL
  Filled 2024-04-04 (×3): qty 1

## 2024-04-04 MED ORDER — VALACYCLOVIR HCL 500 MG PO TABS
1000.0000 mg | ORAL_TABLET | Freq: Two times a day (BID) | ORAL | Status: DC
  Filled 2024-04-04 (×2): qty 2

## 2024-04-04 MED ORDER — SODIUM CHLORIDE 0.9 % IV BOLUS
1000.0000 mL | Freq: Once | INTRAVENOUS | Status: AC
  Administered 2024-04-04: 1000 mL via INTRAVENOUS

## 2024-04-04 NOTE — ED Triage Notes (Signed)
 Pt brought in by EMS from home for c/o nausea & burning all over. Pt states she feels as though her body is on fire. States she started 2 new medicines yesterday - paroxetine & acyclovir. Is oxycodone  three times daily for peripheral neuropathy; has not had any today.

## 2024-04-04 NOTE — ED Notes (Signed)
 Fall bundle in place. Pt requests to keep her shoes on. Fall bracelet placed on pt. Fall alarm activated & audible.

## 2024-04-04 NOTE — ED Notes (Signed)
Facesheet faxed to [facility] 

## 2024-04-04 NOTE — H&P (Addendum)
 History and Physical    Jacqueline Warren FMW:[id number] DOB: 1950/07/02 DOA: 04/04/2024  DOS: the patient was seen and examined on 04/04/2024  PCP: Jyl Railing, MD   Patient coming from: Home  I have personally briefly reviewed patient's old medical records in [facility] and CareEverywhere  HPI:   Jacqueline Warren is a 73 y.o. 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   Pt states she has been feeling unwell with nausea and abdominal pain and advised a bland diet  and states she has been adherent to that as much as she can.  She has lost around 30 pounds recently.  Patient had severe burning and significant pain in her abdomen when she woke up this morning prompting her to come to the ED.  She also states her nausea has worsened as she was previously taking Zofran  which was helping her but is not helping her anymore.  She denies any fevers, chills, headache but states her abdominal pain wraps around to her right mid back.  On arrival to the ED patient was noted to be HDS stable.  Lab work showed patient with elevated LFTs, CBC without leukocytosis, UA without signs of infection, normal acetaminophen  levels, normal lipase and imaging showing concern for choledocholithiasis and with positive Beverley sign felt secondary to her pancreatic mass as well stones were seen on CT or right upper quadrant.  EDP discussed the case with GI team at [facility] and they do not recommend stent placement as that will interfere with the plan Whipple's procedure in 10 days.  Their recommendation is to treat this as pancreatitis with rest, fluids and there may be a potential  transfer to [facility] for further care but currently did not have any beds available.  Transfer may need to be expedited if there is worsening of bilirubin nearing 10 or patient spiking a fever.  Given continued need for care, [facility] contacted for admission.  Review of Systems: As mentioned in the history of present illness. All other systems reviewed and are negative.   Past Medical History:  Diagnosis Date   Adrenal mass    Anxiety    Arthritis    fingers   Breast CA (HCC)    bilateral   Colon adenomas    Diabetes mellitus without complication (HCC)    Family history of adverse reaction to anesthesia    Mother & Sisters - PONV   GERD (gastroesophageal reflux disease)    Hiatal hernia    History of blood transfusion    Hypercholesteremia    Hypertension    IBS (irritable bowel syndrome)    Kidney stones    Lymphedema    mild - both arms   Mass of adrenal gland    Motion sickness    boats, planes   Pancreatic cyst    Pancreatic cyst    Peripheral neuropathy    feet, secondary to DM   Peripheral neuropathy    PONV (postoperative nausea and vomiting)    Seasonal allergies    Skin cancer    melanoma of back; nose    Past Surgical History:  Procedure Laterality Date   ABDOMINAL HYSTERECTOMY     BREAST LUMPECTOMY Bilateral  right breast cancer 1996; left breast cancer 2010   BROW LIFT Bilateral 02/16/2015   Procedure: BLEPHAROPLASTY UPPER EYELID W/EXCESS SKIN;  Surgeon: Greig CHRISTELLA Gay, MD;  Location: [facility];  Service: Ophthalmology;  Laterality: Bilateral;  DIABETIC - oral meds   CATARACT EXTRACTION W/PHACO Left 12/28/2020   Procedure: CATARACT EXTRACTION PHACO AND INTRAOCULAR LENS PLACEMENT (IOC) LEFT DIABETIC;  Surgeon: Jaye Fallow, MD;  Location: [facility];  Service: Ophthalmology;  Laterality: Left;  9.37 0:50.7   CATARACT EXTRACTION W/PHACO Right 01/11/2021   Procedure: CATARACT EXTRACTION PHACO AND INTRAOCULAR LENS PLACEMENT (IOC) RIGHT  DIABETIC;  Surgeon: Jaye Fallow, MD;  Location: [facility];  Service: Ophthalmology;  Laterality: Right;  Diabetic - oral meds 6.65 00:45.4   COLONOSCOPY     COLONOSCOPY WITH PROPOFOL  N/A 10/14/2015   Procedure: COLONOSCOPY WITH PROPOFOL ;  Surgeon: Lamar ONEIDA Holmes, MD;  Location: [facility];  Service: Endoscopy;  Laterality: N/A;   COLONOSCOPY WITH PROPOFOL  N/A 11/05/2019   Procedure: COLONOSCOPY WITH PROPOFOL ;  Surgeon: Toledo, Ladell POUR, MD;  Location: [facility] ENDOSCOPY;  Service: Gastroenterology;  Laterality: N/A;   INSERTION CENTRAL VENOUS ACCESS DEVICE W/ SUBCUTANEOUS PORT     port-a-cath; now removed   MASTECTOMY Left    partial   MOHS SURGERY     nose   PTOSIS REPAIR Bilateral 02/16/2015   Procedure: PTOSIS REPAIR;  Surgeon: Greig CHRISTELLA Gay, MD;  Location: [facility];  Service: Ophthalmology;  Laterality: Bilateral;   SKIN CANCER EXCISION     back   TONSILLECTOMY     TUBAL LIGATION       Allergies  Allergen Reactions   Other Other (See Comments)    Other Reaction: Skin Blisters   Adhesive [Tape] Other (See Comments)    TEGADERM tears skin   Amoxicillin Itching    hands   Clindamycin/Lincomycin Nausea And Vomiting   Crestor [Rosuvastatin]     Caused kidney issues   Silver Other (See Comments)    Breaks skin down    Family History  Problem Relation Age of Onset   Breast cancer Sister    Breast cancer Paternal Aunt    Breast cancer Paternal Aunt    Breast cancer Cousin    Breast cancer Cousin     Prior to Admission medications   Medication Sig Start Date End Date Taking? Authorizing Provider  acetaminophen  (TYLENOL ) 325 MG tablet Take 650 mg by mouth every 6 (six) hours as needed.    [provider]  ASCORBIC ACID CR PO Take by mouth.    [provider]  aspirin 81 MG tablet Take 81 mg by mouth daily.    [provider]  atorvastatin (LIPITOR) 40 MG tablet Take 40 mg by mouth daily. PM    [provider]  benzonatate  (TESSALON ) 100 MG capsule Take 1 capsule (100 mg total) by mouth every 8 (eight) hours. 05/28/23   White, Shelba SAUNDERS, NP  carvedilol (COREG) 25 MG tablet Take 25 mg by mouth 2 (two) times daily with a meal.    [provider]  Cholecalciferol (VITAMIN D3 PO) Take by mouth.    [provider]  COVID-19 mRNA bivalent vaccine, Pfizer, (PFIZER COVID-19 VAC BIVALENT) injection Inject into the muscle. Patient not taking: Reported on 02/21/2024 04/01/21   Luiz Channel, MD  COVID-19 mRNA bivalent vaccine, Pfizer, (PFIZER COVID-19 [facility] BIVALENT) injection Inject into the muscle. Patient not taking: Reported on 02/21/2024 10/25/21   Luiz Channel, MD  COVID-19 mRNA vaccine  [phone] (COMIRNATY ) syringe Inject into the muscle. Patient not taking: Reported on 02/21/2024 04/19/22   Luiz Channel, MD  docusate sodium (COLACE) 50 MG capsule Take 50 mg by mouth 2 (two) times daily.    [provider]  esomeprazole (NEXIUM) 40 MG capsule Take 40 mg by mouth daily at 12 noon. AM    [provider]  ezetimibe (ZETIA) 10 MG tablet Take 10 mg by mouth daily.    [provider]  ferrous sulfate 325 (65 FE) MG tablet Take 325 mg by mouth daily with breakfast.    [provider]  gabapentin (NEURONTIN) 300 MG capsule Take 900 mg by mouth 3 (three) times daily.    [provider]  ibuprofen (ADVIL,MOTRIN) 200 MG tablet Take 200 mg by mouth every 6 (six) hours as needed.    [provider]  influenza vaccine adjuvanted (FLUAD  QUADRIVALENT) 0.5 ML injection Inject into the muscle. Patient not taking: Reported on 02/21/2024 03/02/22     lidocaine  (XYLOCAINE ) 2 % solution Use as directed 15 mLs in the mouth or throat as needed. 05/28/23   Teresa Shelba SAUNDERS, NP  Loratadine 10 MG CAPS Take by mouth. AM    [provider]  Magnesium Oxide (MAG-OX PO) Take by mouth.    [provider]  metFORMIN (GLUCOPHAGE) 1000 MG tablet  Take 500 mg by mouth 2 (two) times daily with a meal.    [provider]  morphine (MS CONTIN) 15 MG 12 hr tablet Take 15 mg by mouth every 12 (twelve) hours.    [provider]  Multiple Vitamins-Minerals (MULTIVITAMIN PO) Take by mouth. AM    [provider]  ondansetron  (ZOFRAN -ODT) 4 MG disintegrating tablet Take 1 tablet (4 mg total) by mouth every 8 (eight) hours as needed for nausea or vomiting. 02/21/24   Corlis Burnard DEL, NP  oxycodone  (OXY-IR) 5 MG capsule Take 5 mg by mouth 2 (two) times daily. Midday and suppertime    [provider]  potassium chloride SA (K-DUR,KLOR-CON) 20 MEQ tablet Take 20 mEq by mouth daily. AM    [provider]  Pyridoxine HCl (VITAMIN B-6 PO) Take by mouth. AM    [provider]  valsartan (DIOVAN) 80 MG tablet Take 120 mg by mouth daily. AM    [provider]      reports that she quit smoking about 55 years ago. Her smoking use included cigarettes. She has never used smokeless tobacco. She reports that she does not drink alcohol and does not use drugs. Lives by her self Tobacco- Very remote use, denies current use EtOH- Denies use.  Illicit drug use- denies use.  IADLs/ADLs- can perform independently at baseline    Physical Exam: Vitals:   04/04/24 1400 04/04/24 1430 04/04/24 1500 04/04/24 1621  BP: (!) 178/96 (!) 166/83 (!) 170/99 (!) 159/77  Pulse: 77 68 69 64  Resp: 15 13 19 13   Temp:    97.9 F (36.6 C)  TempSrc:      SpO2: 96% 95% 99% 94%  Weight:      Height:         Gen: NAD HENT: NCAT CV: RRR, good pulses in all extremities Resp: CTAB Abd: TTP in epigastric and right upper quadrant and right mid back MSK: Right mid back pain appears to wraparound from right upper quadrant Skin: No lesions noted on examined skin Neuro: Alert and oriented x 4 Psych: Normal mood   Labs on Admission: I have  personally reviewed following labs and imaging studies  CBC: Recent Labs  Lab  03/29/24 1343 04/04/24 0830  WBC 13.5* 9.1  NEUTROABS 12.3* 6.8  HGB 12.4 11.8*  HCT 37.3 35.8*  MCV 88.2 85.9  PLT 159 200   Basic Metabolic Panel: Recent Labs  Lab 03/29/24 1343 04/04/24 0830 04/04/24 1645  NA 137 137  --   K 3.8 3.7  --   CL 100 98  --   CO2 25 27  --   GLUCOSE 197* 133*  --   BUN 16 10  --   CREATININE 1.13* 0.61  --   CALCIUM 8.8* 8.8*  --   MG  --   --  1.4*  PHOS  --   --  3.6   GFR: Estimated Creatinine Clearance: 70.5 mL/min (by C-G formula based on SCr of 0.61 mg/dL). Liver Function Tests: Recent Labs  Lab 04/04/24 0830  AST 224*  ALT 139*  ALKPHOS 664*  BILITOT 2.9*  PROT 6.1*  ALBUMIN 2.6*   Recent Labs  Lab 04/04/24 0830  LIPASE 18   No results for input(s): AMMONIA in the last 168 hours. Coagulation Profile: Recent Labs  Lab 04/04/24 1055  INR 1.1   Cardiac Enzymes: Recent Labs  Lab 03/29/24 1343 04/04/24 0830 04/04/24 1055  CKTOTAL  --   --  32*  TROPONINIHS 14 13 12    BNP (last 3 results) No results for input(s): BNP in the last 8760 hours. HbA1C: No results for input(s): HGBA1C in the last 72 hours. CBG: No results for input(s): GLUCAP in the last 168 hours. Lipid Profile: No results for input(s): CHOL, HDL, LDLCALC, TRIG, CHOLHDL, LDLDIRECT in the last 72 hours. Thyroid Function Tests: No results for input(s): TSH, T4TOTAL, FREET4, T3FREE, THYROIDAB in the last 72 hours. Anemia Panel: No results for input(s): VITAMINB12, FOLATE, FERRITIN, TIBC, IRON, RETICCTPCT in the last 72 hours. Urine analysis:    Component Value Date/Time   COLORURINE YELLOW (A) 04/04/2024 0938   APPEARANCEUR CLEAR (A) 04/04/2024 0938   LABSPEC 1.010 04/04/2024 0938   PHURINE 6.0 04/04/2024 0938   GLUCOSEU NEGATIVE 04/04/2024 0938   HGBUR NEGATIVE 04/04/2024 0938   BILIRUBINUR NEGATIVE 04/04/2024 0938   KETONESUR 20 (A) 04/04/2024 0938   PROTEINUR NEGATIVE 04/04/2024 0938   NITRITE  NEGATIVE 04/04/2024 0938   LEUKOCYTESUR NEGATIVE 04/04/2024 0938    Radiological Exams on Admission: I have personally reviewed images US  ABDOMEN LIMITED RUQ (LIVER/GB) Result Date: 04/04/2024 CLINICAL DATA:  [id number] RUQ abdominal pain [id number] EXAM: ULTRASOUND ABDOMEN LIMITED RIGHT UPPER QUADRANT COMPARISON:  CT of the abdomen and pelvis, 04/04/2024 FINDINGS: Gallbladder: No gallstones. Mild circumferential gallbladder wall thickening. No pericholecystic fluid. Positive sonographic Murphy's sign noted by sonographer. Common bile duct: Diameter: 16 mm, remeasured at the radiologist workstation. Liver: Increased echogenicity. No focal lesion identified. Moderate intrahepatic biliary ductal dilation. Portal vein is patent on color Doppler imaging with normal direction of blood flow towards the liver. Right Kidney: Partially visualized. No mass. No hydronephrosis or nephrolithiasis. Other: None. IMPRESSION: 1. Redemonstrated moderate intrahepatic and extrahepatic biliary ductal dilation, without visualized choledocholithiasis. Given the findings on the comparison CT, this may be due to biliary stricturing or external compression. A nonemergent multiphase abdominal MRI with IV contrast is recommended further characterization. 2. Mild circumferential gallbladder wall thickening with a positive sonographic Murphy's sign. In the absence of gallstones and pericholecystic fluid, this may be reactive to the biliary duct dilation. Laboratory correlation recommended. This can also be further evaluated on  the aforementioned abdominal MRI. 3. Hepatic steatosis. Electronically Signed   By: Rogelia Myers M.D.   On: 04/04/2024 11:47   CT ABDOMEN PELVIS W CONTRAST Result Date: 04/04/2024 EXAM: CT ABDOMEN AND PELVIS WITH CONTRAST 04/04/2024 10:35:40 AM TECHNIQUE: CT of the abdomen and pelvis was performed with the administration of 100 mL of iohexol (OMNIPAQUE) 300 MG/ML solution. Multiplanar reformatted images are provided  for review. Automated exposure control, iterative reconstruction, and/or weight-based adjustment of the mA/kV was utilized to reduce the radiation dose to as low as reasonably achievable. COMPARISON: 11/16/2008 CLINICAL HISTORY: Abdominal pain, acute, nonlocalized. Abdominal pain. Delay extended to encompass entire liver. FINDINGS: LOWER CHEST: Postop changes in the left breast, incompletely visualized. LIVER: No discrete liver lesions. GALLBLADDER AND BILE DUCTS: Gallbladder is unremarkable. Moderate intra and extrahepatic biliary ductal dilatation. SPLEEN: No acute abnormality. PANCREAS: 3.3 cm complex cystic lesion in the inferior pancreatic head and a separate 3.9 cm complex cystic pancreatic lesion at the pancreatic head/body junction. 1.6 cm complex cystic lesion at the junction of the pancreas body and tail . There is mild diffuse parenchymal atrophy. No ductal dilatation. Mild regional inflammatory changes about the pancreatic head and neck. ADRENAL GLANDS: 3.3 cm left adrenal mass, previously 3.1 cm, consistent with benign process. KIDNEYS, URETERS AND BLADDER: Small mid left and inferior right renal cortical cysts. No stones in the kidneys or ureters. No hydronephrosis. No perinephric or periureteral stranding. Urinary bladder is unremarkable. GI AND BOWEL: Stomach demonstrates no acute abnormality. A few scattered sigmoid diverticula without adjacent inflammatory change. There is no bowel obstruction. PERITONEUM AND RETROPERITONEUM: No ascites. No free air. VASCULATURE: Aorta is normal in caliber. Mild scattered air-iliac calcified plaque without aneurysm. LYMPH NODES: No lymphadenopathy. REPRODUCTIVE ORGANS: Post hysterectomy. BONES AND SOFT TISSUES: Advanced degenerative disc disease L3-S1 with grade 1 anterolisthesis L3-4, L4-5. 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 Bilateral pelvic phleboliths. No acute osseous abnormality. No focal soft tissue abnormality. IMPRESSION: 1. Multiple complex cystic  pancreatic lesions with intra- and extrahepatic biliary ductal dilatation. Considerations include multifocal neoplasm versus stigmata of chronic pancreatitis. Comparison with any more recent outside imaging may be useful. Recommend GI consultation. 2. No evidence of metastatic disease. Electronically signed by: Katheleen Faes MD 04/04/2024 10:52 AM EDT RP Workstation: [id number]    EKG: My personal interpretation of EKG shows: Sinus rhythm without any acute ST changes, with right bundle branch block that is unchanged from previous.  Assessment/Plan Principal Problem:   Acute pancreatitis Active Problems:   Pancreatic mass   HTN (hypertension)   Hyperlipidemia   T2DM (type 2 diabetes mellitus) (HCC)   GERD (gastroesophageal reflux disease)   Patient with abdominal pain, nausea and image finding concerning for pancreatitis and an choledocholithiasis with cholecystitis which is all secondary to compression effect from the pancreas.  Right upper quadrant ultrasound currently shows mild circumferential gallbladder wall thickening but no gallstones.  She has acalculous cholecystitis secondary to compression of the pancreatic head.  Although patient's lipase is normal she meets 2 out of 3 criteria for acute pancreatitis.  EDP discussed with Dr. Zani at [facility] who recommends treating her presentation similar to acute pancreatitis and placing patient on a wait list to transfer to do.  She will benefit from being transferred as majority of her care for pancreatic mass is present there and her GI, oncology and surgical teams are present there as well.  Will continue IVF, pain control, and antiemetics.  Patient also started on cefepime and Flagyl.  Will trend LFTs.  Message GI specialist here Dr. Onita who recommends reaching back out to GI team if her LFTs continue to worsen.  No interventions planned at this time.  If LFTs worsen and patient's presentation worsens, she may need to be evaluated for percutaneous  cholecystostomy but will defer this to her primary team and [facility] as there are plans for Whipple's procedure is in 10 days.  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  Continue home gabapentin.  VTE prophylaxis:  Xarelto  Diet: Code Status:  Full Code Telemetry:  Admission status: Inpatient, Telemetry bed Patient is from: Home Anticipated d/c is to: [facility]/Home Anticipated d/c is in: 4-5 days   Family Communication: Updated at bedside  Consults called: None   Severity of Illness: The appropriate patient status for this patient is INPATIENT. Inpatient status is judged to be reasonable and necessary in order to provide the required intensity of service to ensure the patient's safety. The patient's presenting symptoms, physical exam findings, and initial radiographic and laboratory data in the context of their chronic comorbidities is felt to place them at high risk for further clinical deterioration. Furthermore, it is not anticipated that the patient will be medically stable for discharge from the hospital within 2 midnights of admission.   * I certify that at the point of admission it is my clinical judgment that the patient will require inpatient hospital care spanning beyond 2 midnights from the point of admission due to high intensity of service, high risk for further deterioration and high frequency of surveillance required.DEWAINE Morene Bathe, MD Jolynn DEL. [facility]

## 2024-04-04 NOTE — ED Notes (Signed)
 Call and spoke with Jess at [facility] for transfer for this patient.

## 2024-04-04 NOTE — ED Notes (Signed)
 Pt assisted with bedpan

## 2024-04-04 NOTE — Hospital Course (Addendum)
 Acute Pancreatitis vs Acalculous cholecystitis Pt presented with abdominal pain and nausea with image finding concerning for pancreatitis and an choledocholithiasis with cholecystitis which is all secondary to compression effect from the pancreas.  Right upper quadrant ultrasound currently shows mild circumferential gallbladder wall thickening but no gallstones.  She has acalculous cholecystitis secondary to compression of the pancreatic head.  Although patient's lipase is normal she meets 2 out of 3 criteria for acute pancreatitis.  EDP discussed with Dr. Zani at [facility] who recommends treating her presentation similar to acute pancreatitis and placing patient on a wait list to transfer to do.  She was given IVF, pain control and kept NPO with small advancement in her diet. She was also started on cefepime and flagyl. Patient was transferred to [facility] given her treatment team was located there and she could have worsening of her liver function necessitating not interventions not available at [facility].     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  Continued home gabapentin.

## 2024-04-04 NOTE — ED Notes (Signed)
 Images--transfer to [facility]

## 2024-04-04 NOTE — Progress Notes (Signed)
 Approximately 1630--Pt arrived to unit from ED. Upon arrival, VS obtained and telemetry initiated. Full assessment complete by this RN. All fall precautions in place. Pt oriented to room and unit. Pt's daughter at bedside.

## 2024-04-04 NOTE — ED Notes (Signed)
 Lab called for blood draw.

## 2024-04-04 NOTE — Discharge Summary (Signed)
 Physician Discharge Summary   Patient: Jacqueline Warren MRN: [id number] DOB: 1951/04/23  Admit date:     04/04/2024  Discharge date: 04/04/24  Discharge Physician: Morene Bathe   PCP: Jyl Railing, MD   Discharge Diagnoses: Principal Problem:   Acute pancreatitis Active Problems:   Pancreatic mass   HTN (hypertension)   Hyperlipidemia   T2DM (type 2 diabetes mellitus) (HCC)   GERD (gastroesophageal reflux disease)  Hospital Course: Acute Pancreatitis vs Acalculous cholecystitis Pt presented with abdominal pain and nausea with image finding concerning for pancreatitis and an choledocholithiasis with cholecystitis which is all secondary to compression effect from the pancreas.  Right upper quadrant ultrasound currently shows mild circumferential gallbladder wall thickening but no gallstones.  She has acalculous cholecystitis secondary to compression of the pancreatic head.  Although patient's lipase is normal she meets 2 out of 3 criteria for acute pancreatitis.  EDP discussed with Dr. Zani at [facility] who recommends treating her presentation similar to acute pancreatitis and placing patient on a wait list to transfer to do.  She was given IVF, pain control and kept NPO with small advancement in her diet. She was also started on cefepime and flagyl. Patient was transferred to [facility] given her treatment team was located there and she could have worsening of her liver function necessitating not interventions not available at [facility].     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  Continued home gabapentin.        Consultants: GI Procedures performed: None  Disposition: [facility] Diet recommendation:  NPO with ADAT DISCHARGE MEDICATION: Allergies as of 04/04/2024       Reactions   Other  Other (See Comments)   Other Reaction: Skin Blisters   Adhesive [tape] Other (See Comments)   TEGADERM tears skin   Amoxicillin Itching   hands   Clindamycin/lincomycin Nausea And Vomiting   Crestor [rosuvastatin]    Caused kidney issues   Silver Other (See Comments)   Breaks skin down        Medication List     PAUSE taking these medications    atorvastatin 40 MG tablet Wait to take this until your doctor or other care provider tells you to start again. Commonly known as: LIPITOR Take 40 mg by mouth daily. PM   docusate sodium 50 MG capsule Wait to take this until your doctor or other care provider tells you to start again. Commonly known as: COLACE Take 50 mg by mouth 2 (two) times daily.   ezetimibe 10 MG tablet Wait to take this until your doctor or other care provider tells you to start again. Commonly known as: ZETIA Take 10 mg by mouth daily.   metFORMIN 500 MG 24 hr tablet Wait to take this until your doctor or other care provider tells you to start again. Commonly known as: GLUCOPHAGE-XR Take 500 mg by mouth every evening.       STOP taking these medications    ondansetron  4 MG disintegrating tablet Commonly known as: ZOFRAN -ODT   potassium chloride 10 MEQ tablet Commonly known as: KLOR-CON M       TAKE these medications    acetaminophen  325 MG tablet Commonly known as: TYLENOL  Take 2 tablets (650 mg total) by mouth every 6 (six) hours as needed for mild pain (pain score 1-3) or fever (or Fever >/= 101).  What changed:  medication strength how much to take reasons to take this   amLODipine 5 MG tablet Commonly known as: NORVASC Take 5 mg by mouth 2 (two) times daily.   carvedilol 25 MG tablet Commonly known as: COREG Take 25 mg by mouth 2 (two) times daily with a meal.   ceFEPIme 2 g in sodium chloride  0.9 % 100 mL Inject 2 g into the vein every 8 (eight) hours. Start taking on: April 05, 2024   esomeprazole 40 MG capsule Commonly  known as: NEXIUM Take 40 mg by mouth daily at 12 noon. AM   gabapentin 300 MG capsule Commonly known as: NEURONTIN Take 900 mg by mouth 3 (three) times daily.   heparin 5000 UNIT/ML injection Inject 1 mL (5,000 Units total) into the skin every 8 (eight) hours. Start taking on: April 05, 2024   lactated ringers  infusion Inject 100 mLs into the vein continuous.   lidocaine  5 % Commonly known as: LIDODERM  Place 1 patch onto the skin daily. Remove & Discard patch within 12 hours or as directed by MD Start taking on: April 05, 2024   magnesium oxide 400 MG tablet Commonly known as: MAG-OX Take 400 mg by mouth daily.   methocarbamol 500 MG tablet Commonly known as: ROBAXIN Take 1 tablet (500 mg total) by mouth every 6 (six) hours as needed for muscle spasms.   metroNIDAZOLE 500 MG/100ML Commonly known as: FLAGYL Inject 100 mLs (500 mg total) into the vein every 12 (twelve) hours. Start taking on: April 05, 2024   morphine 15 MG 12 hr tablet Commonly known as: MS CONTIN Take 1 tablet (15 mg total) by mouth every 12 (twelve) hours. Start taking on: April 05, 2024   ondansetron  4 MG tablet Commonly known as: ZOFRAN  Take 1 tablet (4 mg total) by mouth every 6 (six) hours as needed for nausea.   ondansetron  4 MG/2ML Soln injection Commonly known as: ZOFRAN  Inject 2 mLs (4 mg total) into the vein every 6 (six) hours as needed for nausea.   oxyCODONE  5 MG immediate release tablet Commonly known as: Oxy IR/ROXICODONE  Take 5 mg by mouth every 4 (four) hours as needed for moderate pain (pain score 4-6).   PARoxetine 20 MG tablet Commonly known as: PAXIL Take 20 mg by mouth daily.   pyridOXINE 50 MG tablet Commonly known as: B-6 Take 100 mg by mouth daily.   senna-docusate 8.6-50 MG tablet Commonly known as: Senokot-S Take 1 tablet by mouth at bedtime as needed for mild constipation.   valACYclovir 1000 MG tablet Commonly known as: VALTREX Take 1,000 mg by mouth  2 (two) times daily.   valsartan 320 MG tablet Commonly known as: DIOVAN Take 320 mg by mouth daily.   vitamin D3 25 MCG tablet Commonly known as: CHOLECALCIFEROL Take 2 tablets (2,000 Units total) by mouth every morning. Start taking on: April 05, 2024 What changed: medication strength        Discharge Exam: Filed Weights   04/04/24 0750  Weight: 92.1 kg   Today's Vitals   04/04/24 1621 04/04/24 1759 04/04/24 1844 04/04/24 1913  BP: (!) 159/77   (!) 157/70  Pulse: 64   69  Resp: 13   14  Temp: 97.9 F (36.6 C)   98 F (36.7 C)  TempSrc:      SpO2: 94%   94%  Weight:      Height:      PainSc:  9  Asleep    Body  mass index is 32.77 kg/m.   Condition at discharge: stable  The results of significant diagnostics from this hospitalization (including imaging, microbiology, ancillary and laboratory) are listed below for reference.   Imaging Studies: US  ABDOMEN LIMITED RUQ (LIVER/GB) Result Date: 04/04/2024 CLINICAL DATA:  [id number] RUQ abdominal pain [id number] EXAM: ULTRASOUND ABDOMEN LIMITED RIGHT UPPER QUADRANT COMPARISON:  CT of the abdomen and pelvis, 04/04/2024 FINDINGS: Gallbladder: No gallstones. Mild circumferential gallbladder wall thickening. No pericholecystic fluid. Positive sonographic Murphy's sign noted by sonographer. Common bile duct: Diameter: 16 mm, remeasured at the radiologist workstation. Liver: Increased echogenicity. No focal lesion identified. Moderate intrahepatic biliary ductal dilation. Portal vein is patent on color Doppler imaging with normal direction of blood flow towards the liver. Right Kidney: Partially visualized. No mass. No hydronephrosis or nephrolithiasis. Other: None. IMPRESSION: 1. Redemonstrated moderate intrahepatic and extrahepatic biliary ductal dilation, without visualized choledocholithiasis. Given the findings on the comparison CT, this may be due to biliary stricturing or external compression. A nonemergent multiphase abdominal MRI  with IV contrast is recommended further characterization. 2. Mild circumferential gallbladder wall thickening with a positive sonographic Murphy's sign. In the absence of gallstones and pericholecystic fluid, this may be reactive to the biliary duct dilation. Laboratory correlation recommended. This can also be further evaluated on the aforementioned abdominal MRI. 3. Hepatic steatosis. Electronically Signed   By: Rogelia Myers M.D.   On: 04/04/2024 11:47   CT ABDOMEN PELVIS W CONTRAST Result Date: 04/04/2024 EXAM: CT ABDOMEN AND PELVIS WITH CONTRAST 04/04/2024 10:35:40 AM TECHNIQUE: CT of the abdomen and pelvis was performed with the administration of 100 mL of iohexol (OMNIPAQUE) 300 MG/ML solution. Multiplanar reformatted images are provided for review. Automated exposure control, iterative reconstruction, and/or weight-based adjustment of the mA/kV was utilized to reduce the radiation dose to as low as reasonably achievable. COMPARISON: 11/16/2008 CLINICAL HISTORY: Abdominal pain, acute, nonlocalized. Abdominal pain. Delay extended to encompass entire liver. FINDINGS: LOWER CHEST: Postop changes in the left breast, incompletely visualized. LIVER: No discrete liver lesions. GALLBLADDER AND BILE DUCTS: Gallbladder is unremarkable. Moderate intra and extrahepatic biliary ductal dilatation. SPLEEN: No acute abnormality. PANCREAS: 3.3 cm complex cystic lesion in the inferior pancreatic head and a separate 3.9 cm complex cystic pancreatic lesion at the pancreatic head/body junction. 1.6 cm complex cystic lesion at the junction of the pancreas body and tail . There is mild diffuse parenchymal atrophy. No ductal dilatation. Mild regional inflammatory changes about the pancreatic head and neck. ADRENAL GLANDS: 3.3 cm left adrenal mass, previously 3.1 cm, consistent with benign process. KIDNEYS, URETERS AND BLADDER: Small mid left and inferior right renal cortical cysts. No stones in the kidneys or ureters. No  hydronephrosis. No perinephric or periureteral stranding. Urinary bladder is unremarkable. GI AND BOWEL: Stomach demonstrates no acute abnormality. A few scattered sigmoid diverticula without adjacent inflammatory change. There is no bowel obstruction. PERITONEUM AND RETROPERITONEUM: No ascites. No free air. VASCULATURE: Aorta is normal in caliber. Mild scattered air-iliac calcified plaque without aneurysm. LYMPH NODES: No lymphadenopathy. REPRODUCTIVE ORGANS: Post hysterectomy. BONES AND SOFT TISSUES: Advanced degenerative disc disease L3-S1 with grade 1 anterolisthesis L3-4, L4-5. 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 Bilateral pelvic phleboliths. No acute osseous abnormality. No focal soft tissue abnormality. IMPRESSION: 1. Multiple complex cystic pancreatic lesions with intra- and extrahepatic biliary ductal dilatation. Considerations include multifocal neoplasm versus stigmata of chronic pancreatitis. Comparison with any more recent outside imaging may be useful. Recommend GI consultation. 2. No evidence of metastatic disease. Electronically signed by: Dayne Hassell MD  04/04/2024 10:52 AM EDT RP Workstation: [id number]   MM 3D SCREENING MAMMOGRAM BILATERAL BREAST Result Date: 03/25/2024 CLINICAL DATA:  Screening. EXAM: DIGITAL SCREENING BILATERAL MAMMOGRAM WITH TOMOSYNTHESIS AND CAD TECHNIQUE: Bilateral screening digital craniocaudal and mediolateral oblique mammograms were obtained. Bilateral screening digital breast tomosynthesis was performed. The images were evaluated with computer-aided detection. Best images possible per technologist communication. COMPARISON:  Previous exam(s). ACR Breast Density Category b: There are scattered areas of fibroglandular density. FINDINGS: There are no findings suspicious for malignancy. Stable bilateral postsurgical changes. IMPRESSION: No mammographic evidence of malignancy. A result letter of this screening mammogram will be mailed directly to the patient.  RECOMMENDATION: Screening mammogram in one year. (Code:SM-B-01Y) BI-RADS CATEGORY  2: Benign. Electronically Signed   By: Corean Salter M.D.   On: 03/25/2024 10:15    Microbiology: Results for orders placed or performed during the hospital encounter of 11/03/19  SARS CORONAVIRUS 2 (TAT 6-24 HRS) Nasopharyngeal Nasopharyngeal Swab     Status: None   Collection Time: 11/03/19 12:23 PM   Specimen: Nasopharyngeal Swab  Result Value Ref Range Status   SARS Coronavirus 2 NEGATIVE NEGATIVE Final    Comment: (NOTE) SARS-CoV-2 target nucleic acids are NOT DETECTED. The SARS-CoV-2 RNA is generally detectable in upper and lower respiratory specimens during the acute phase of infection. Negative results do not preclude SARS-CoV-2 infection, do not rule out co-infections with other pathogens, and should not be used as the sole basis for treatment or other patient management decisions. Negative results must be combined with clinical observations, patient history, and epidemiological information. The expected result is Negative. Fact Sheet for Patients: HairSlick.no Fact Sheet for Healthcare Providers: quierodirigir.com This test is not yet approved or cleared by the [place]  FDA and  has been authorized for detection and/or diagnosis of SARS-CoV-2 by FDA under an Emergency Use Authorization (EUA). This EUA will remain  in effect (meaning this test can be used) for the duration of the COVID-19 declaration under Section 56 4(b)(1) of the Act, 21 U.S.C. section 360bbb-3(b)(1), unless the authorization is terminated or revoked sooner. Performed at [facility], [address] [place], [place] [address]     Labs: CBC: Recent Labs  Lab 03/29/24 1343 04/04/24 0830  WBC 13.5* 9.1  NEUTROABS 12.3* 6.8  HGB 12.4 11.8*  HCT 37.3 35.8*  MCV 88.2 85.9  PLT 159 200   Basic Metabolic Panel: Recent Labs  Lab 03/29/24 1343  04/04/24 0830 04/04/24 1645  NA 137 137  --   K 3.8 3.7  --   CL 100 98  --   CO2 25 27  --   GLUCOSE 197* 133*  --   BUN 16 10  --   CREATININE 1.13* 0.61  --   CALCIUM 8.8* 8.8*  --   MG  --   --  1.4*  PHOS  --   --  3.6   Liver Function Tests: Recent Labs  Lab 04/04/24 0830  AST 224*  ALT 139*  ALKPHOS 664*  BILITOT 2.9*  PROT 6.1*  ALBUMIN 2.6*   CBG: No results for input(s): GLUCAP in the last 168 hours.  Discharge time spent: less than 30 minutes.  Signed: Morene Bathe, MD [facility] 04/04/2024

## 2024-04-04 NOTE — ED Provider Notes (Signed)
 [facility] Provider Note    Event Date/Time   First MD Initiated Contact with Patient 04/04/24 0745     (approximate)   History   Nausea   HPI  Jacqueline Warren is a 73 y.o. female with history of hypertension, chronic pain on OxyContin  and plan for Whipple procedure who comes in with nausea.  Patient reports that yesterday she was started on paroxetine and Aciclovir.  She is chronically on oxycodone  3 times daily for peripheral neuropathy has not had any today.  She reports having nausea and burning all over.  Patient reports discomfort in her abdomen but she does report chronic issues with discomfort.  Denies any headaches, shortness of breath or other concerns.  I reviewed patient's blood work from 03/12/2024 where patient had normal LFTs. I reviewed patient's MRCP from 9/14 that showed pancreatic mass   Physical Exam   Triage Vital Signs: ED Triage Vitals  Encounter Vitals Group     BP      Girls Systolic BP Percentile      Girls Diastolic BP Percentile      Boys Systolic BP Percentile      Boys Diastolic BP Percentile      Pulse      Resp      Temp      Temp src      SpO2      Weight      Height      Head Circumference      Peak Flow      Pain Score      Pain Loc      Pain Education      Exclude from Growth Chart     Most recent vital signs: Vitals:   04/04/24 0748  BP: (!) 155/82  Pulse: 72  Resp: 17  Temp: 98.2 F (36.8 C)  SpO2: 95%     General: Awake, no distress.  CV:  Good peripheral perfusion.  Resp:  Normal effort.  Abd:  No distention.  Slight tenderness in the upper abdomen without any rebound, guarding. Other:     ED Results / Procedures / Treatments   Labs (all labs ordered are listed, but only abnormal results are displayed) Labs Reviewed  CBC WITH DIFFERENTIAL/PLATELET - Abnormal; Notable for the following components:      Result Value   Hemoglobin 11.8 (*)    HCT 35.8 (*)    Abs Immature Granulocytes  0.09 (*)    All other components within normal limits  COMPREHENSIVE METABOLIC PANEL WITH GFR - Abnormal; Notable for the following components:   Glucose, Bld 133 (*)    Calcium 8.8 (*)    Total Protein 6.1 (*)    Albumin 2.6 (*)    AST 224 (*)    ALT 139 (*)    Alkaline Phosphatase 664 (*)    Total Bilirubin 2.9 (*)    All other components within normal limits  URINALYSIS, ROUTINE W REFLEX MICROSCOPIC - Abnormal; Notable for the following components:   Color, Urine YELLOW (*)    APPearance CLEAR (*)    Ketones, ur 20 (*)    All other components within normal limits  LIPASE, BLOOD  TROPONIN I (HIGH SENSITIVITY)  TROPONIN I (HIGH SENSITIVITY)     EKG  My interpretation of EKG:  Normal sinus rhythm 75 without any ST elevation, T wave inversions in lead III V2 through V3, right bundle branch block  RADIOLOGY I have reviewed the us   personally and interpreted no gallstones    PROCEDURES:  Critical Care performed: No  .1-3 Lead EKG Interpretation  Performed by: Ernest Ronal BRAVO, MD Authorized by: Ernest Ronal BRAVO, MD     Interpretation: normal     ECG rate:  70   ECG rate assessment: normal     Rhythm: sinus rhythm     Ectopy: none     Conduction: normal      MEDICATIONS ORDERED IN ED: Medications  sodium chloride  0.9 % bolus 500 mL (has no administration in time range)  HYDROmorphone (DILAUDID) injection 1 mg (1 mg Intravenous Given 04/04/24 0929)  ondansetron  (ZOFRAN ) injection 4 mg (4 mg Intravenous Given 04/04/24 0929)  pantoprazole (PROTONIX) injection 40 mg (40 mg Intravenous Given 04/04/24 0929)  gabapentin (NEURONTIN) capsule 900 mg (900 mg Oral Given 04/04/24 0929)  iohexol (OMNIPAQUE) 300 MG/ML solution 100 mL (100 mLs Intravenous Contrast Given 04/04/24 1025)     IMPRESSION / MDM / ASSESSMENT AND PLAN / ED COURSE  I reviewed the triage vital signs and the nursing notes.   Patient's presentation is most consistent with acute presentation with potential  threat to life or bodily function.   Patient comes in with burning sensation nausea, vomiting.  Does have chronic pain patient be treated with some Dilaudid, Zofran , Protonix and will proceed with blood work to further evaluate.  Urine with slight ketones in it so we will give some IV fluids CBC with normal white count hemoglobin slightly low but around baseline.  CMP does show elevated LFTs and patient had normal LFTs a few days ago.  Troponin is negative lipase is normal.  Given elevated LFTs we will proceed with INR testing, ultrasound to evaluate for gallstones and CT imaging to evaluate for progression of mass.  Patient's family is requesting transfer to [facility].  Given patient does get all her medical care there we will call them once we get the rest of the test back so we know what could be potentially causing this elevated LFTs.  They expressed understanding felt comfortable with this plan.  CT imaging consistent with pancreatic masses with biliary dilation.  Will discuss with [facility] given family's preference for transfer here.   11:34 AM discussed with Onita- no indication for MRCP agree with transfer for ERCP. does recommend starting patient on Zosyn but given patient's allergy will do cefepime, Flagyl.  Patient having additional pain so given additional milligram of Dilaudid.  Patient's INR was reassuring troponins are negative x 2 CK normal Tylenol  negative  No current beds available over at [facility].  Discussed the case with the surgeon over there Dr. Woodroe not recommend patient have stent placement at this time due to a prevent her from getting Whipple surgery given her total bilirubin is not that high and that she has no fever or other symptoms that can just manage symptomatically.  Recommend treating like a pancreatitis picture with clear liquid diet, pain control, nausea medicine.  This can be done at our hospital however patient would prefer to go over to [facility] although there are no beds  available  Patient is on the wait list for [facility] with Dr. Zani-given is unclear when they would have a bed available I will discuss with our hospital team for admission given we can treat patient here if there is no bed that is available.  Dr. Onita did agree with this plan does not need to be consulted on as there is no interventions that will be done.  If  bilirubin is trending upward or if patient spikes a fever then patient will need to be rediscussed with [facility] prior to patient being transferred there.  The patient is on the cardiac monitor to evaluate for evidence of arrhythmia and/or significant heart rate changes.      FINAL CLINICAL IMPRESSION(S) / ED DIAGNOSES   Final diagnoses:  Nausea and vomiting, unspecified vomiting type  Abnormal LFTs  Biliary obstruction (HCC)     Rx / DC Orders   ED Discharge Orders     None        Note:  This document was prepared using Dragon voice recognition software and may include unintentional dictation errors.   Ernest Ronal BRAVO, MD 04/04/24 [phone]

## 2024-04-09 LAB — CULTURE, BLOOD (ROUTINE X 2)
Culture: NO GROWTH
Culture: NO GROWTH
Special Requests: ADEQUATE
Special Requests: ADEQUATE

## 2024-04-29 ENCOUNTER — Other Ambulatory Visit: Payer: Self-pay | Admitting: Internal Medicine

## 2024-04-29 NOTE — Telephone Encounter (Signed)
 NOT [facility] PATIENT

## 2024-06-23 NOTE — Progress Notes (Signed)
 TRANSITIONAL CARE TELEPHONE ENCOUNTER NOTE   Jacqueline Warren is a 74 y.o. female who was contacted after SNF stay at [facility] on 06/20/2024 for surgical aftercare following surgery on the digestive system  The following were reviewed with the patient today:   Note: Only barriers and interventions are noted below. If no flowsheet data is found, then no issues were identified or the patient was not able to be reached.    Patient Knowledge and Self Care      06/23/2024    5:48 PM  Patient Knowledge and Self-Care  Did the patient/caregiver understand the primary reason for hospitalization? Yes  Is the patient or the patient's caregiver able to describe what is necessary for self care at this time? No  Are there any additional educational needs identified today? No     Discharge Follow-Up Appointment      06/23/2024    5:48 PM  Appointments  Does the patient have an appropriate follow-up visit scheduled within 14 days of discharge? Yes  Date of Appointment: 06/24/2024  Are there any barriers to the patient keeping their appointment? No  Has the patient been hospitalized more than once in the past 30 days? Yes    Financial Resource Strain: Low Risk  (06/23/2024)   Overall Financial Resource Strain (CARDIA)    Difficulty of Paying Living Expenses: Not very hard    Transportation Needs: No Transportation Needs (06/23/2024)   PRAPARE - Administrator, Civil Service (Medical): No    Lack of Transportation (Non-Medical): No    Food Insecurity: No Food Insecurity (06/23/2024)   Hunger Vital Sign    Worried About Running Out of Food in the Last Year: Never true    Ran Out of Food in the Last Year: Never true      Medication Review      06/23/2024    5:48 PM  Medication Review  Were any new medicines prescribed at discharge? Yes  Was the patient able to get all of their prescriptions? Yes  Does the patient understand their medications including how to take them? Yes   Were there any medication interventions/escalations? No  Reconciled current and discharge medications? (RN and Pharmacist ONLY) No  Additional comments did not have a med list from rehab to review against      New or Worsening Health Issues      06/23/2024    5:48 PM  New or Worsening Health Issues  Is the patient experiencing any new or worsening symptoms after discharge? Yes*  Symptoms noted following discharge: Other (see comments)  Additional Comments diarrhea, she called triage line today      Home Health       06/23/2024    5:48 PM  Home Health  Has your home care agency contacted you? Yes  Has your home care professional visited you after discharge? Yes    Is patient eligible for TCM coding?:     06/23/2024    5:48 PM  [facility] Coding Eligibility  Did you communicate with the patient and/or caregiver within 2 business days of discharge OR Did you document two or more separate communication attempts within 2 business days of discharge? Yes  Is the patient eligible for TCM billing? Yes - (patient is eligible for TCM coding)    The importance of keeping the appointment was emphasized to her.  Future Appointments     Date/Time Provider Department Center Visit Type   06/24/2024  1:30 PM (Arrive by 1:00 PM)  [facility] [place] CVC PLACEMENT   06/25/2024 1:00 PM (Arrive by 12:45 PM) PORT NURSE [facility] Lab Draw [facility] LAB   06/25/2024 2:00 PM (Arrive by 1:45 PM) Reidy-Lagunes, Diane, MD [facility] RETURN VISIT   06/25/2024 2:15 PM (Arrive by 1:45 PM) OTC [facility] Oncology Treatment [facility] Ctr ONCOLOGY TX 3HR   06/27/2024 2:30 PM (Arrive by 2:15 PM) D PREADMISSION TESTING PAGE RD [facility]est[facility] VIDEO VISIT NEW   07/02/2024 4:30 PM (Arrive by 4:15 PM) Claude Rolan KANDICE DOUGLAS, MD [facility] RETURN VISIT   07/16/2024 2:00 PM (Arrive by  1:45 PM) Lue Belita Alert, [facility] Palliative Care 3-1 Cancer Ctr RETURN VISIT   08/18/2024 2:30 PM (Arrive by 2:15 PM) Jyl Heron Haff, MD [facility] [place] [facility] OFFICE VISIT   10/01/2024 3:00 PM Callwood, Cara Endow, MD [facility] C FOLLOW UP   10/27/2024 2:00 PM (Arrive by 1:45 PM) Vivian Rollene Fish [facility] GENETIC COUNSELING (UNAFFECTED)   02/23/2025 2:00 PM (Arrive by 1:45 PM) POPULATION HEALTH NURSE - [facility] [place] [facility] WELLNESS   02/23/2025 3:15 PM (Arrive by 3:00 PM) Jyl Heron Haff, MD [facility] [place] [facility] OFFICE VISIT      Spoke with Jacqueline Warren today.She has had diarrhea, she called the triage line at the cancer center today and has taken lomotil and has taken oxycocdone for abd pain and is feeling better. She has port placement scheduled for tomorrow. She is drinking fluids and staying hydrated at this time. WEnt over her meds. She denies any HRSN. She has this [facility] RN;s number if she needs to reach out. She has [facility]. They came out on Saturday. She has their number if she needs to reach back out.  Jacqueline L CREECH   RN *Some images could not be shown.

## 2024-06-24 NOTE — H&P (Signed)
 [facility] PRE-PROCEDURE ASSESSMENT SUBJECTIVE:  Jacqueline Warren is a 74 y.o. year old female with PMH of pancreatic cancer presenting for port placement under moderate sedation.  Pertinent ROS: ROS: Denies fevers, chills Venous access: Peripheral IV Precaution: none  ASA CLASS: ASA 3 - Patient with moderate systemic disease with functional limitations  Airway (Mallampati): MP Classification III  ALLERGIES: Allergies  Allergen Reactions   Amoxicillin Itching    Patient took amoxicillin for dental procedure. Soon after developed itching and hand swelling.    Other Other (See Comments)    Other Reaction: Skin Blisters   Tegaderm [Transparent Dressings] Other (See Comments)    Other Reaction: Skin Blisters   Adhesive Rash   Clindamycin Nausea and Vomiting   Rosuvastatin Other (See Comments)    Caused kidney issues   Silver Other (See Comments)    Breaks skin down    MEDICATIONS: Current Outpatient Medications  Medication Sig Dispense Refill   acetaminophen  (TYLENOL ) 500 mg capsule Take 500 mg by mouth every 6 (six) hours as needed for Pain     amLODIPine  (NORVASC ) 5 MG tablet Take 1 tablet (5 mg total) by mouth 2 (two) times daily (Patient taking differently: Take 5 mg by mouth 2 (two) times daily as needed (if BP is over 150)) 180 tablet 3   atorvastatin  (LIPITOR) 40 MG tablet TAKE 1 TABLET BY MOUTH EVERY DAY 100 tablet 3   blood glucose diagnostic test strip Use once daily. Use as instructed. 100 each 12   calcium  carbonate (TUMS E-X) 300 mg (750 mg) chewable tablet Take 600 mg of elemental by mouth every 2 (two) hours as needed for Heartburn     carvediloL  (COREG ) 25 MG tablet TAKE 1 TABLET BY MOUTH TWICE A DAY WITH FOOD (Patient taking differently: Take 1 tablet by mouth 2 (two) times daily as needed) 180 tablet 3   cholecalciferol (VITAMIN D3) 2,000 unit capsule Take 2,000 Units by mouth every morning    (Patient not taking: Reported on  06/23/2024)     DENTA 5000 PLUS 1.1 % Apply a thin ribbon to toothbrush and brush twice daily for 2 minutes. No eating or drinking for 30 minutes after.     diabetic supplies, miscellan. Misc Use     enoxaparin (LOVENOX) 40 mg/0.4 mL injection syringe Inject 0.4 mLs (40 mg total) subcutaneously once daily for 18 days in abdomen, rotating site with each injection.     esomeprazole (NEXIUM) 40 MG DR capsule Take 1 capsule (40 mg total) by mouth once daily 90 capsule 3   ezetimibe  (ZETIA ) 10 mg tablet TAKE 1 TABLET BY MOUTH EVERY DAY 100 tablet 3   famotidine (PEPCID) 10 MG tablet Take 10 mg by mouth at bedtime     gabapentin  (NEURONTIN ) 300 MG capsule Take 3 capsules by mouth 3 (three) times daily     KLOR-CON M10 10 mEq ER tablet TAKE 2 TABLETS (20 MEQ TOTAL) BY MOUTH ONCE DAILY 3 TIMES A WEEK (Patient taking differently: Take 2 tablets by mouth every Monday, Wednesday, and Friday) 78 tablet 2   lactose-reduced food (BOOST HIGH PROTEIN ORAL) Take by mouth 2 (two) times daily     lancets (ONETOUCH DELICA LANCETS) 30 gauge Misc Inject 1 each subcutaneously once daily 100 each 2   lipase-protease-amylase, pork, (CREON) 24,000-76,000-120,000 unit DR capsule Take 2 capsules by mouth 3 (three) times daily with meals 180 capsule 0   loratadine (CLARITIN) 10 mg tablet Take 1 tablet by mouth once daily  magnesium  oxide (MAG-OX) 400 mg (241.3 mg magnesium ) tablet Take 1 tablet (400 mg total) by mouth once daily 100 tablet 3   metFORMIN (GLUCOPHAGE-XR) 500 MG XR tablet Take 1 tablet by mouth daily with dinner     morphine  (MS CONTIN ) 15 MG ER tablet Take 1 tablet (15 mg total) by mouth every 12 (twelve) hours for 30 days 60 tablet 0   ondansetron  (ZOFRAN ) 4 MG tablet Take 1 tablet (4 mg total) by mouth every 8 (eight) hours as needed for Nausea Hold while taking zofran  4mg      ondansetron  (ZOFRAN -ODT) 4 MG disintegrating tablet One po bid or as directed by gastroenterology (Patient taking  differently: Take 1 tablet by mouth 2 (two) times daily as directed by gastroenterology) 60 tablet 11   ONETOUCH VERIO TEST STRIPS test strip ONCE DAILY FOR 196 DAYS USE AS INSTRUCTED. 100 strip 3   oxyCODONE  (ROXICODONE ) 5 MG immediate release tablet Take 1 tablet (5 mg total) by mouth every 4 (four) hours as needed for Pain for up to 5 days 15 tablet 0   oxyCODONE  (ROXICODONE ) 5 MG immediate release tablet Take 1 tablet (5 mg total) by mouth every 4 (four) hours as needed for Pain 90 tablet 0   PARoxetine  (PAXIL ) 20 MG tablet TAKE 1 TABLET BY MOUTH EVERY DAY 90 tablet 4   [Paused] valsartan (DIOVAN) 320 MG tablet Take 1 tablet (320 mg total) by mouth once daily (Patient not taking: Reported on 06/23/2024) 90 tablet 3   No current facility-administered medications for this encounter.     PROBLEM LIST: Problem List Items Addressed This Visit       Oncology   Pancreatic cancer metastasized to liver (CMS/HHS-HCC)   Relevant Orders   IR central venous catheter placement    Past Medical History:  Diagnosis Date   Abnormal Pap smear    1990s: hyperplasia   Basal cell carcinoma    september 2020- removed from left leg   Breast cancer (CMS/HHS-HCC)    Cancer (CMS/HHS-HCC)    both breasts s/p bilateral lumpectomy   Difficult intravenous access    Diverticulitis 2012   DM2 (diabetes mellitus, type 2) (CMS/HHS-HCC)    Elevated alkaline phosphatase level    Fibroid 1991   GERD (gastroesophageal reflux disease)    History of blood transfusion    History of bone density study 08/17/2008   History of bone density study 12/01/2010   History of chemotherapy    History of hysterectomy for benign disease    fibroid tx   History of radiation therapy    History of snoring    Patient endorses witnessed snoring, no sleep study   Hx of adenomatous colonic polyps 01/20/2019   Hyperlipidemia    Hypertension    Kidney stone    history, patient passed   Mass of adrenal  gland (HHS-HCC)    Melanoma of back (CMS/HHS-HCC)    Dx 8016   Other osteoporosis without current pathological fracture 04/05/2022   Pancreatic cyst (HHS-HCC)    Pneumococcal vaccination given 11/21/2010   PONV (postoperative nausea and vomiting)    OK if she receives meds in pre-op   Poor intravenous access    Wears glasses     PAST SURGICAL HISTORY: Past Surgical History:  Procedure Laterality Date   COLONOSCOPY  03/29/2000   Adenomatous Polyp   COLONOSCOPY  10/17/2004   PH Adenomatous Polyp   EGD  03/27/2007   No repeat per RTE   EGD  05/22/2007  No repeat per RTE   COLONOSCOPY  11/25/2009   PH Adenomatous Polyp: CBF 11/2014; Recall Ltr mailed 01/19/2015 (dw)   MASTECTOMY PARTIAL Left 02/13/2012   Procedure: MASTECTOMY PARTIAL;  Surgeon: Ernie Ginny Maillard, MD;  [facility];  Service: General Surgery;  Laterality: Left;  this will also be a seroma excision of her left breast   MASTECTOMY PARTIAL Left 04/29/2013   Procedure: MASTECTOMY PARTIAL, this will be a left central quadrectomy and evacuation of breast seroma;  Surgeon: Ernie Ginny Maillard, MD;  Location: [facility];  Service: General Surgery;  Laterality: Left;   COLONOSCOPY  10/14/2015   Tubulovillous Adenoma: CBF 10/2018: Recall ltr mailed    COLONOSCOPY  11/05/2019   Tubular adenoma of the colon/PHx CP/Repeat 86yrs/TKT   UPPER GASTROINTESTINAL ENDOSCOPY N/A 07/22/2020   Procedure: UPPER EUS;  Surgeon: Elta Fonda Mt, MD;  [facility];  Service: Gastroenterology;  Laterality: N/A;   UPPER GASTROINTESTINAL ENDOSCOPY N/A 07/01/2021   Procedure: UPPER EUS;;  Surgeon: Elta Fonda Mt, MD;  [facility];  Service: Gastroenterology;  Laterality: N/A;   UPPER GASTROINTESTINAL ENDOSCOPY N/A 07/04/2022   Procedure: UPPER EUS;  Surgeon: Elta Fonda Mt, MD;  [facility];  Service: Gastroenterology;  Laterality: N/A;   UPPER  GASTROINTESTINAL ENDOSCOPY N/A 12/24/2023   Procedure: ESOPHAGOGASTRODUODENOSCOPY, FLEXIBLE, TRANSORAL; W/TRANSENDOSCOPIC ULTRASOUND-GUIDED INTRAMURAL OR TRANSMURAL FINE NEEDLE ASPIRATION/BIOPSY, (ESOPHAGUS, STOMACH OR DUODENUM, AND ADJACENT STRUCTURES);  Surgeon: Jowell, Paul Simon, MD;  [facility]: [facility];  Service: Gastroenterology;  Laterality: N/A;   UPPER GASTROINTESTINAL ENDOSCOPY N/A 03/07/2024   Procedure: ESOPHAGOGASTRODUODENOSCOPY, FLEXIBLE, TRANSORAL; W/TRANSENDOSCOPIC ULTRASOUND-GUIDED INTRAMURAL OR TRANSMURAL FINE NEEDLE ASPIRATION/BIOPSY, (ESOPHAGUS, STOMACH OR DUODENUM, AND ADJACENT STRUCTURES);  Surgeon: Jama Fonda Ruse, MD;  [facility]: [facility];  Service: Gastroenterology;  Laterality: N/A;   ENDOSCOPIC RETROGRADE CHOLANGIO-PANCREATOGRAPHY W/BILIARY TUBE/STENT N/A 04/07/2024   Procedure: ERCP - Endoscopic Retrograde Cholangiopancreatography;  Surgeon: Elta Fonda Mt, MD;  [facility]: [facility];  Service: Gastroenterology;  Laterality: N/A;  2x stent   ENDOSCOPY OF BILIARY DUCT  04/07/2024   Procedure: ENDOSCOPIC CATHETERIZATION OF THE BILIARY DUCTAL SYSTEM, RADIOLOGICAL SUPERVISION AND INTERPRETATION;  Surgeon: Elta Fonda Mt, MD;  [facility]: [facility];  Service: Gastroenterology;;   EXPLORATORY LAPAROTOMY N/A 05/26/2024   Procedure: EXPLORATORY LAPAROTOMY, EXPLORATORY CELIOTOMY WITH OR WITHOUT BIOPSY(S) (SEPARATE PROCEDURE);  Surgeon: Blazer, Dan G III, MD;  Location[facility];  Service: General Surgery;  Laterality: N/A;   BIOPSY LIVER OPEN N/A 05/26/2024   Procedure: BIOPSY OF LIVER, WEDGE;  Surgeon: Claude Rolan KANDICE DOUGLAS, MD;  [facility];  Service: General Surgery;  Laterality: N/A;   CHOLECYSTECTOMY N/A 05/26/2024   Procedure: CHOLECYSTECTOMY;  Surgeon: Claude Rolan KANDICE DOUGLAS, MD;  [facility];  Service: General Surgery;  Laterality: N/A;   basal cell removal of left leg     sept. 2020    BREAST BIOPSY     BREAST LUMPECTOMY     HYSTERECTOMY     Complete - 2001   OOPHORECTOMY     POLYPECTOMY     seroma removal 02/2012     TONSILLECTOMY     TUBAL LIGATION      PHYSICAL EXAM:    Vitals: There were no vitals taken for this visit. There is no height or weight on file to calculate BMI. General appearance: alert, cooperative, pleasant, in no acute distress Heart: hemodynamically stable Lungs: normal work of breathing on room air LABS:  No results for input(s): APTT,  INR, PTT in the last 168 hours. Recent Labs  Lab 06/18/24 0933  NA 138  K 4.2  CL 100  CO2 31  BUN 9  CREATININE 0.7  GLUCOSE 152*  CALCIUM  9.5   Recent Labs  Lab 06/18/24 0933  WBC 6.0  HGB 11.0*  HCT 33.8*  PLT 400    ASSESSMENT/PLAN:  Pulmonary, cardiac, airway, and mental status appropriate for this procedure mentioned above.  Moderate sedation is appropriate for this patient at this time.   Procedure: as above  Antibiotics: The patient will require ancef to be administered immediately prior to the procedure.   Post-procedure care: Outpatient to be discharged from recovery suite.   The patient was interviewed and examined, and the available chart reviewed.  The indications, procedure, risk, potential complications, and alternatives for both the procedure and for moderate sedation were explained and discussed.  The patient understands and has signed the informed consent for the procedure and moderate sedation.  CODE STATUS: FULL CODE

## 2024-06-30 NOTE — Telephone Encounter (Signed)
 MRN: [id number] DOB: 04/18/1951  74 y.o. female Jacqueline Warren, Jacqueline Haff, MD Allergies  Allergen Reactions   Amoxicillin Itching    Patient took amoxicillin for dental procedure. Soon after developed itching and hand swelling.    Other Other (See Comments)    Other Reaction: Skin Blisters   Tegaderm [Transparent Dressings] Other (See Comments)    Other Reaction: Skin Blisters   Adhesive Rash   Clindamycin Nausea and Vomiting   Rosuvastatin Other (See Comments)    Caused kidney issues   Silver Other (See Comments)    Breaks skin down    Immunization History  Administered Date(s) Administered   COVID-19 Pfizer Monovalent Vaccine 07/24/2019, 08/20/2019, 09/21/2020   COVID-19 Pfizer Monovalent Vaccine (original formulation) 03/15/2020   COVID-19 vaccine >12 years (Pfizer-Biontech, Bivalent) IM Injection 30 mcg/0.62mL 04/01/2021, 10/25/2021   Covid-19 Vaccine 52mcg/0.3ml (>=5yrs) Comirnaty  Pfizer-Biontech 04/21/2022, 02/23/2023, 09/07/2023   Flu Vaccine HD-IIV3 High Dose, IM PF(76yo+)(Fluzone) 03/18/2015, 03/13/2018, 02/13/2023, 03/12/2024   Flu Vaccine IIV3, IM PF (29mo+)(Fluarix, FluLaval, Fluzone) 03/12/2013   Influenza IIV4, High Dose IM (65 Yr+) (FLUZONE QUAD) 03/12/2019, 02/16/2020, 03/02/2022   Influenza IIV4, IM PF (65 Yr+) (FLUAD  QUAD) 04/13/2021   Influenza IIV4, IM pres-free 03/17/2014   Influenza TIV (IM) 05/17/2012   Influenza, IM unspecified 05/17/2012, 03/12/2013, 03/17/2014, 03/18/2015, 02/10/2017, 02/14/2017   PNEUMOCOCCAL (PCV13) (BIRTH-68YR) VACCINE (PREVNAR 13) 03/18/2015   PNEUMOCOCCAL (PPSV23)(>=29YRS -OR- >=2 YRS WITH RISK) VACCINE (PNEUMOVAX 23) 03/17/2014, 03/17/2014, 06/09/2019   Pneumococcal (PCV21) (>=68YRS) Vaccine (Capvaxive) 08/20/2023   RSV Adult Vaccine (>=44yr and Pregnancy 32-36 weeks ONLY)(NO Medicare Patients)(ABRYSVO) 03/30/2022   RZV(>=61YR -OR-19+YRS IF  IMMCOMP) VACCINE (SHINGRIX ) 10/04/2022, 01/10/2023   TDAP (>=30YR) VACCINE  (ADACEL/BOOSTRIX ) 06/23/2021   Wt Readings from Last 1 Encounters:  06/25/24 1343 84.3 kg (185 lb 13.6 oz)     Opening statement given. Pt verfied with three different HIPAA identifiers.   Patient is experiencing new/worsening symptoms. Patient reports last night her neuropathy in her feet began to hurt more 10/10 currently 9/10 and took Oxycodone  @ 9am. Takes Morphine  15 MR ER every 12, Oxycodone  5MG  every 4 hours and Gabapentin  TID. Pt took oxycodone  every 4 hours as prescribed but does not typically take it that often and was told to follow up/notify oncologist if neuropathy got worse. States that the oxycodone  typically does make the pain tolerable but due to pain she has been taking it more frequently. Reports bilateral nerve pain in feet and some numbness which she reports is chronic.   Oncologist: Kingston, MD and Bertrum, NP Diagnoses: Pancreatic cancer Chemo last Wednesday - C1 D1 of abraxane + gemzar  Hx of peripheral neuropathy   This RN reviewed all assessment questions with patient during call. Per protocol notified patient of disposition to be seen Care Advice given per protocol, patient should see Physician Within 24 Hours . RN provided patient with closing statement. Call back parameters given. Instructed caller to seek immediate medical attention for any new/worsening symptoms, current symptoms worsen or if you become increasingly concerned. Instructed caller to call back for any questions or concerns.  Caller verbalizes understanding and is without questions at this time.                   Reason for Disposition  Numbness in one foot (i.e., loss of sensation)    Numbness bilaterally (chronic)  Additional Information  Negative: Followed a foot injury  Negative: Diabetes  Negative: Ankle pain is main symptom  Negative: Thigh or calf pain is main symptom  Negative: Entire foot is cool or blue in comparison to other foot  Negative: Purple or black  skin on foot or toe  Negative: [1] Red area or streak AND [2] fever  Negative: [1] Swollen foot AND [2] fever  Negative: Patient sounds very sick or weak to the triager  Negative: [1] SEVERE pain (e.g., excruciating, unable to do any normal activities) AND [2] not improved after 2 hours of pain medicine    Last took pain medication at 9am (1 hour 20 minutes ago)  Negative: [1] Looks infected (spreading redness, pus) AND [2] large red area (> 2 in. or 5 cm)  Negative: Looks like a boil, infected sore, or deep ulcer  Negative: [1] Redness of the skin AND [2] no fever  Negative: [1] Swollen foot AND [2] no fever  (Exceptions: localized bump from bunions, calluses, insect bite, sting)  Answer Assessment - Initial Assessment Questions 1. ONSET: When did the pain start?  worsened last night 2. LOCATION: Where is the pain located?  bilateral feet 3. PAIN: How bad is the pain?    (Scale 1-10; or mild, moderate, severe)   -  MILD (1-3): doesn't interfere with normal activities    -  MODERATE (4-7): interferes with normal activities (e.g., work or school) or awakens from sleep, limping    -  SEVERE (8-10): excruciating pain, unable to do any normal activities, unable to walk severe (9/10) 4. WORK OR EXERCISE: Has there been any recent work or exercise that involved this part of the body?  recent chemotherapy 5. CAUSE: What do you think is causing the foot pain? neuropathy 6. OTHER SYMPTOMS: Do you have any other symptoms? (e.g., leg pain, rash, fever, numbness) denies 7. PREGNANCY: Is there any chance you are pregnant? When was your last menstrual period? Response:  Protocols used: Foot Pain-A-AH

## 2024-07-01 NOTE — ED Provider Notes (Signed)
 Initial Provider Assessment at Triage   A preliminary evaluation of this patient was completed in the triage area. Care was initiated and the patient was taken immediately back to a treatment space, where the primary team will continue and complete the management of this patient.    Jillyn Fairy Rogue, MD 07/01/24 [phone]

## 2024-07-01 NOTE — ED Triage Notes (Signed)
 Pt to ED d/t progressive confusion today starting around 1300 today. Pt had gallbladder removed in December, pt with amber-colored drainage from wound. +chest pain, SOB, malaise, nausea. Febrile in triage. In October pt had stent placed in bile duct.  Pt had first chemo treatment last Wednesday. Pt with hx pancreatic cancer with mets to liver.   Past Medical History:  Diagnosis Date   Abnormal Pap smear    1990s: hyperplasia   Basal cell carcinoma    september 2020- removed from left leg   Breast cancer (CMS/HHS-HCC)    Cancer (CMS/HHS-HCC)    both breasts s/p bilateral lumpectomy   Class 2 severe obesity due to excess calories with serious comorbidity and body mass index (BMI) of 37.0 to 37.9 in adult 02/19/2023   Difficult intravenous access    Diverticulitis 2012   DM2 (diabetes mellitus, type 2) (CMS/HHS-HCC)    Elevated alkaline phosphatase level    Fibroid 1991   GERD (gastroesophageal reflux disease)    History of blood transfusion    History of bone density study 08/17/2008   History of bone density study 12/01/2010   History of chemotherapy    History of hysterectomy for benign disease    fibroid tx   History of radiation therapy    History of snoring    Patient endorses witnessed snoring, no sleep study   Hx of adenomatous colonic polyps 01/20/2019   Hyperlipidemia    Hypertension    Kidney stone    history, patient passed   Mass of adrenal gland (HHS-HCC)    Melanoma of back (CMS/HHS-HCC)    Dx 8016   Motion sickness    Other osteoporosis without current pathological fracture 04/05/2022   Pancreatic cyst (HHS-HCC)    Pneumococcal vaccination given 11/21/2010   PONV (postoperative nausea and vomiting)    OK if she receives meds in pre-op   Poor intravenous access    Wears glasses

## 2024-07-01 NOTE — ED Provider Notes (Signed)
 " [facility] EMERGENCY DEPT  ED Provider Note History   Chief Complaint  Patient presents with   Oncology And Fever   Altered Mental Status    History of Present Illness    Patient is a 74 yo F PMH significant for melanoma, breast cancer, pancreatic cancer, HTN, HLD, IBS, depression who is presenting today for altered mental status, generalized fatigue. Patient reports she felt at her baseline this morning, went to lay down after breakfast, woke up around 1600 and was surprised to have slept all day. Reports that she felt disoriented and unlike herself. She has noted increased drainage to her abdominal wound since yesterday, but denies associated pain, nausea, vomiting, changes in appetite, chest pain, SOB, upper respiratory symptoms. Denies fevers at home. Denies concerns regarding recent port placement last Tuesday. Reports it was accessed this past Wednesday.   Per ED Triage RN, Pt to ED d/t progressive confusion today starting around 1300 today. Pt had gallbladder removed in December, pt with amber-colored drainage from wound. +chest pain, SOB, malaise, nausea. Febrile in triage. In October pt had stent placed in bile duct.  Pt had first chemo treatment last Wednesday. Pt with hx pancreatic cancer with mets to liver. Patient Info:    History provided by:  Patient and relative   Language interpreter used: No    Level of Interpreter Services: No interpreter needed (no language barrier)  Past Medical History:  Diagnosis Date   Abnormal Pap smear    1990s: hyperplasia   Basal cell carcinoma    september 2020- removed from left leg   Breast cancer (CMS/HHS-HCC)    Cancer (CMS/HHS-HCC)    both breasts s/p bilateral lumpectomy   Class 2 severe obesity due to excess calories with serious comorbidity and body mass index (BMI) of 37.0 to 37.9 in adult 02/19/2023   Difficult intravenous access    Diverticulitis 2012   DM2 (diabetes mellitus, type 2) (CMS/HHS-HCC)    Elevated  alkaline phosphatase level    Fibroid 1991   GERD (gastroesophageal reflux disease)    History of blood transfusion    History of bone density study 08/17/2008   History of bone density study 12/01/2010   History of chemotherapy    History of hysterectomy for benign disease    fibroid tx   History of radiation therapy    History of snoring    Patient endorses witnessed snoring, no sleep study   Hx of adenomatous colonic polyps 01/20/2019   Hyperlipidemia    Hypertension    Kidney stone    history, patient passed   Mass of adrenal gland (HHS-HCC)    Melanoma of back (CMS/HHS-HCC)    Dx 1983   Motion sickness    Other osteoporosis without current pathological fracture 04/05/2022   Pancreatic cyst (HHS-HCC)    Pneumococcal vaccination given 11/21/2010   PONV (postoperative nausea and vomiting)    OK if she receives meds in pre-op   Poor intravenous access    Wears glasses    Past Surgical History:  Procedure Laterality Date   COLONOSCOPY  03/29/2000   Adenomatous Polyp   COLONOSCOPY  10/17/2004   PH Adenomatous Polyp   EGD  03/27/2007   No repeat per RTE   EGD  05/22/2007   No repeat per RTE   COLONOSCOPY  11/25/2009   PH Adenomatous Polyp: CBF 11/2014; Recall Ltr mailed 01/19/2015 (dw)   MASTECTOMY PARTIAL Left 02/13/2012   Procedure: MASTECTOMY PARTIAL;  Surgeon: Ernie Ginny Maillard, MD;  Location:  [facility] OR;  Service: General Surgery;  Laterality: Left;  this will also be a seroma excision of her left breast   MASTECTOMY PARTIAL Left 04/29/2013   Procedure: MASTECTOMY PARTIAL, this will be a left central quadrectomy and evacuation of breast seroma;  Surgeon: Ernie Ginny Maillard, MD;  Location: [facility] OR;  Service: General Surgery;  Laterality: Left;   COLONOSCOPY  10/14/2015   Tubulovillous Adenoma: CBF 10/2018: Recall ltr mailed    COLONOSCOPY  11/05/2019   Tubular adenoma of the colon/PHx CP/Repeat 48yrs/TKT   UPPER GASTROINTESTINAL  ENDOSCOPY N/A 07/22/2020   Procedure: UPPER EUS;  Surgeon: Elta Fonda Mt, MD;  Location: [facility];  Service: Gastroenterology;  Laterality: N/A;   UPPER GASTROINTESTINAL ENDOSCOPY N/A 07/01/2021   Procedure: UPPER EUS;;  Surgeon: Elta Fonda Mt, MD;  Location: [facility];  Service: Gastroenterology;  Laterality: N/A;   UPPER GASTROINTESTINAL ENDOSCOPY N/A 07/04/2022   Procedure: UPPER EUS;  Surgeon: Elta Fonda Mt, MD;  Location: [facility];  Service: Gastroenterology;  Laterality: N/A;   UPPER GASTROINTESTINAL ENDOSCOPY N/A 12/24/2023   Procedure: ESOPHAGOGASTRODUODENOSCOPY, FLEXIBLE, TRANSORAL; W/TRANSENDOSCOPIC ULTRASOUND-GUIDED INTRAMURAL OR TRANSMURAL FINE NEEDLE ASPIRATION/BIOPSY, (ESOPHAGUS, STOMACH OR DUODENUM, AND ADJACENT STRUCTURES);  Surgeon: Jowell, Paul Simon, MD;  Location: [facility];  Service: Gastroenterology;  Laterality: N/A;   UPPER GASTROINTESTINAL ENDOSCOPY N/A 03/07/2024   Procedure: ESOPHAGOGASTRODUODENOSCOPY, FLEXIBLE, TRANSORAL; W/TRANSENDOSCOPIC ULTRASOUND-GUIDED INTRAMURAL OR TRANSMURAL FINE NEEDLE ASPIRATION/BIOPSY, (ESOPHAGUS, STOMACH OR DUODENUM, AND ADJACENT STRUCTURES);  Surgeon: Jama Fonda Ruse, MD;  Location: [facility];  Service: Gastroenterology;  Laterality: N/A;   ENDOSCOPIC RETROGRADE CHOLANGIO-PANCREATOGRAPHY W/BILIARY TUBE/STENT N/A 04/07/2024   Procedure: ERCP - Endoscopic Retrograde Cholangiopancreatography;  Surgeon: Elta Fonda Mt, MD;  Location: [facility];  Service: Gastroenterology;  Laterality: N/A;  2x stent   ENDOSCOPY OF BILIARY DUCT  04/07/2024   Procedure: ENDOSCOPIC CATHETERIZATION OF THE BILIARY DUCTAL SYSTEM, RADIOLOGICAL SUPERVISION AND INTERPRETATION;  Surgeon: Elta Fonda Mt, MD;  Location: [facility];  Service: Gastroenterology;;   EXPLORATORY LAPAROTOMY N/A 05/26/2024   Procedure: EXPLORATORY LAPAROTOMY, EXPLORATORY  CELIOTOMY WITH OR WITHOUT BIOPSY(S) (SEPARATE PROCEDURE);  Surgeon: Blazer, Dan G III, MD;  Location: [facility];  Service: General Surgery;  Laterality: N/A;   BIOPSY LIVER OPEN N/A 05/26/2024   Procedure: BIOPSY OF LIVER, WEDGE;  Surgeon: Claude Rolan KANDICE DOUGLAS, MD;  Location: [facility];  Service: General Surgery;  Laterality: N/A;   CHOLECYSTECTOMY N/A 05/26/2024   Procedure: CHOLECYSTECTOMY;  Surgeon: Claude Rolan KANDICE DOUGLAS, MD;  Location: [facility];  Service: General Surgery;  Laterality: N/A;   basal cell removal of left leg     sept. 2020   BREAST BIOPSY     BREAST LUMPECTOMY     HYSTERECTOMY     Complete - 2001   OOPHORECTOMY     POLYPECTOMY     seroma removal 02/2012     TONSILLECTOMY     TUBAL LIGATION     Family History  Problem Relation Age of Onset   Irregular Heart Beat (Arrhythmia) Mother    Heart disease Mother    High blood pressure (Hypertension) Mother    Osteoporosis (Thinning of bones) Mother    Glaucoma Mother    Stroke Mother        passed 10/10/2012   PONV Mother    Anesthesia problems Mother        Would get sick   High blood pressure (Hypertension) Father    Myocardial Infarction (Heart attack) Father  Diabetes type II Father    Cancer Father    Stroke Father    Prostate cancer Father    Skin cancer Father    Anesthesia problems Sister        Would get sick   Diabetes type II Sister    Hyperlipidemia (Elevated cholesterol) Sister    PONV Sister    PONV Sister    Anesthesia problems Sister    Breast cancer Sister    Osteoporosis (Thinning of bones) Maternal Grandmother    Osteoporosis (Thinning of bones) Paternal Grandmother    Thyroid disease Paternal Grandmother    No Known Problems Daughter    High blood pressure (Hypertension) Maternal Uncle    Breast cancer Paternal Aunt    Breast cancer Paternal Aunt    Breast cancer Cousin    Breast cancer Cousin    Malignant hyperthermia Neg Hx    Social  History   Socioeconomic History   Marital status: Widowed   Number of children: 1   Years of education: 12   Highest education level: 12th grade  Occupational History   Occupation: Retired  Tobacco Use   Smoking status: Former    Current packs/day: 0.00    Average packs/day: 0.3 packs/day for 2.0 years (0.5 ttl pk-yrs)    Types: Cigarettes    Start date: 10/25/1966    Quit date: 10/24/1968    Years since quitting: 55.7    Passive exposure: Past   Smokeless tobacco: Never  Vaping Use   Vaping status: Never Used  Substance and Sexual Activity   Alcohol use: No   Drug use: No   Sexual activity: Not Currently    Partners: Male    Birth control/protection: Condom, I.U.D.  Social History Narrative   Widowed.    02/18/2024   Military Service: None   Likes/Enjoys/What fills your day?:  Narrative   Home: [employer] is on: First Level   Fewest Steps to enter the home: 2 with railings   Other persons in the home: lives alone   Pets: none   Medical equipment you use daily: None   Medical equipment available in the home: [employer]   Dental: significant dental problems. Last screening Date: April 2025 Screening is: Up to date. Dr. Celestia   Vision: Denies any issues with Vision,Wears Reading glasses,Prescription Glasses,Contact Lenses}.. Last screening Date: February 2025 Screening is: Up to date. [facility]   Hearing: hearing loss in both ears.    Dermatology: Denies any areas of concern on skin. Last screening November 2024. Screening is: up to date.    Social Drivers of Corporate Investment Banker Strain: Low Risk  (06/23/2024)   Overall Financial Resource Strain (CARDIA)    Difficulty of Paying Living Expenses: Not very hard  Food Insecurity: No Food Insecurity (06/23/2024)   Hunger Vital Sign    Worried About Running Out of Food in the Last Year: Never true    Ran Out of Food in the Last Year: Never true  Transportation Needs:  No Transportation Needs (06/23/2024)   PRAPARE - Administrator, Civil Service (Medical): No    Lack of Transportation (Non-Medical): No  Physical Activity: Inactive (04/03/2024)   Exercise Vital Sign    Days of Exercise per Week: 0 days    Minutes of Exercise per Session: 0 min  Social Connections: Moderately Integrated (04/03/2024)   Social Connection and Isolation Panel    Frequency of Communication with  Friends and Family: More than three times a week    Frequency of Social Gatherings with Friends and Family: More than three times a week    Attends Religious Services: More than 4 times per year    Active Member of [employer] or Organizations: Yes    Attends Banker Meetings: More than 4 times per year    Marital Status: Widowed  Housing Stability: Low Risk  (06/24/2024)   Housing Stability Vital Sign    Unable to Pay for Housing in the Last Year: No    Number of Times Moved in the Last Year: 0    Homeless in the Last Year: No   Review of Systems  All other systems reviewed and are negative.   Physical Exam  BP (!) 145/83 (BP Location: Right calf, Patient Position: Lying)   Pulse 97   Temp 37.1 C (98.7 F) (Oral)   Resp 18   Wt 85.8 kg (189 lb 1.6 oz)   SpO2 98%   BMI 32.68 kg/m  Physical Exam Vitals and nursing note reviewed.  Cardiovascular:     Rate and Rhythm: Regular rhythm. Tachycardia present.     Pulses: Normal pulses.     Heart sounds: Normal heart sounds.  Pulmonary:     Effort: Pulmonary effort is normal.     Breath sounds: Normal breath sounds.  Abdominal:     Palpations: Abdomen is soft.     Tenderness: There is no abdominal tenderness.  Skin:    General: Skin is warm and dry.  Neurological:     Mental Status: She is alert and oriented to person, place, and time.     Comments: Patient answering all questions appropriately.     Physical Exam    Procedures  Procedures   Results  Medical Decision Making    Medical Decision Making     Assessment and Plan:     Patient is a 74 yo F PMH significant for melanoma, breast cancer, pancreatic cancer, HTN, HLD, IBS, depression who is presenting today for altered mental status, generalized fatigue. Vital signs notable for fever, tachycardia. Family (daughter) at bedside also expresses concern regarding patient appearing disoriented earlier today, sleeping throughout the day, and being confused about the time/what day it was. States this is not typical for her. They have noted a change in her abdominal wound and feel it appears worse than usual and has had increased drainage and erythema.   EKG: Sinus tachycardia, RBBB (similar to priors).  7:21 PM Patient now endorsing chest discomfort. Added cardiac markers.  Ordering patient's home narcotic medication.   POC glucose 137. CMP demonstrates hypoalbuminemia, otherwise unremarkable. CBC shows thrombocytopenia at 137. WBC 3.8 (prior 8.4). Lactate not elevated. Mag 1.1. Bandemia on manual WBC. BNP not elevated. Initial trop 6.   CXR: IMPRESSION: No evidence of consolidation, pleural effusion, or pneumothorax.  CTPE: Suboptimal exam secondary to bolus timing and arm position. No evidence of acute pulmonary embolism to the level of the segmental pulmonary arteries.  Right thyroid lobe nodule measuring up to 1.2 cm. If not  previously done and within the goals of care consider dedicated ultrasound for further evaluation..  Please refer to same day CT abdomen and pelvis for findings below the diaphragm.    CT A/P: Postsurgical changes of Whipple with ill-defined stranding/fluid within the former region of the pancreatic head. These findings are favored postsurgical and there is no discrete fluid collection identified, though pancreatitis can appear similarly. Correlate with white count  and lipase.  Post surgical changes of the anterior abdominal wall with small air-containing collection measuring up to 2.9  cm.   CT Brain: IMPRESSION:  No acute intracranial abnormalities.   10:07 PM Consulting Oncology regarding admission.  Differential Diagnosis:    In addition to the differential diagnoses listed, other diagnoses were considered.      Sepsis vs Localized Infection, Worsening Metastatic Disease  Medical Complexity:    New and requires workup.     Pertinent labs & imaging results that were available during my care of the patient were reviewed by me and considered in my medical decision making.     I reviewed previous medical records.         ONC Hx Per Note on 01/14 Office Visit:  1983: excised melanoma of back 1996: R breast cancer, lumpectomy  2010: L breast cancer, lumpectomy, axillary dissection, chemotherapy, radiation, tamoxifen  2010: Incidental finding of IPMN  2013 & 2014: L partial mastectomy and seroma excision  2020: excised basal cell carcinoma  2022 - 2025: serial EUS and endoscopies to monitor pancreatic cyst  12/07/23: MRI Abdomen/MRCP demonstrated multiple cystic pancreatic lesions, left adrenal adenoma  12/24/2023: EUS repeat of pancreatic lesions, noted additional cystic lesions at that time  09/13 - 02/25/24: patient admitted to [facility] for nausea, steatorrhea, 15 lb weight loss, epigastric pain; CT A/P showed enlargement of lesions, concern for acute pancreatitis, bile duct dilation with ductal enhancement, cholangitis; MRI A/P / MRCP: masslike fullness of pancreatic head/uncinate process with cystic foci measuring up to 3.3 cm in pancreatic head and uncinate process with enhancing septations, partial hypoenhancement, layering fluid with restricted diffusion. Mild smooth biliary wall thickening and enhancement of common bile duct concerning for cholangitis. Heterogeneously enhancing 3.3 cm left adrenal nodule with features suggestive of lipid poor adenoma.  03/07/24: EUS with FNA diagnostic of neoplasm Irregular hypoechoic mass in pancreatic head (34 x 31 mm), cystic  components, bile duct cut off at this site, three cysts in pancreatic head, one cyst in uncinate process (21 x 21 mm), four cysts in body/tail, mild pancreatic duct dilation, common bile duct dilation, no abnormal lymph nodes  04/04/24: Presented to ED for epigastric pain, nausea, vomiting; CT A/P: multiple complex cystic pancreatic lesions with intra and extrahepatic biliary ductal dilatation.  04/05/24: Patient admitted to [facility] for further management. 04/07/24: ERCP showed severely dilated main bile duct, no definitive stricture, mild resistance at distal bile duct, pancreatic and biliary stents placed  04/10/24: nasojejunal feeding tube placed  04/18/24: patient discharged to nursing and rehab facility  05/21/24: patient consented for whipple, was tolerating po and had improvement in symptoms  05/26/24: patient admitted for whipple procedure for IPMN and elevated CA 19-9. Intraoperative findings showed multiple liver nodules, open wedge biopsy performed, choelcystectomy performed. Whipple aborted after pathology confirmed adenocarcinoma. Pathology showed pancreaticobiliary primary, gallbladder - acute or chronic cholecystitis, adenocarcinoma in liver. 12/15 - 06/04/24: hospitalized at [facility] for surgery and post-op care, wound infection and packing occurred, treated with abx 06/04/24: patient discharged to rehab facility  06/13/24: office visit for follow up        Medications Administered in the Emergency Department   lidocaine  (XYLOCAINE ) 1 % injection 0.5 mL (has no administration in time range) dextrose 50% in water solution 12.5-25 g (has no administration in time range) glucagon (GLUCAGEN) injection 1 mg (has no administration in time range) lactated Ringers  infusion ( Intravenous Rate/Dose Verify 07/02/24 1041) enoxaparin (LOVENOX) 40 mg/0.4 mL inj syringe 40 mg ( Subcutaneous  Automatically Held 07/09/24 0900) ceFEPIme  (MAXIPIME ) 1 g in sodium chloride  0.9 % 100 mL IVPB (0 g Intravenous  Stopped 07/02/24 0648) metroNIDAZOLE  in NaCl (FLAGYL ) IVPB 500 mg (0 mg Intravenous Stopped 07/02/24 0823) oxyCODONE  (ROXICODONE ) immediate release tablet 5 mg (5 mg Oral Given 07/02/24 1001) morphine  (MS CONTIN ) ER tablet 15 mg (15 mg Oral Given 07/02/24 1001) pantoprazole  (PROTONIX ) EC tablet 40 mg (40 mg Oral Given 07/02/24 1041) ezetimibe  (ZETIA ) tablet 10 mg (has no administration in time range) famotidine (PEPCID) tablet 10 mg (has no administration in time range) lipase-protease-amylase (pork) (CREON) 24,000-76,000-120,000 unit DR capsule 2 capsule (2 capsules Oral Given 07/02/24 1042) loratadine (CLARITIN) tablet 10 mg (10 mg Oral Given 07/02/24 1041) magnesium  oxide (MAG-OX) tablet 400 mg (has no administration in time range) PARoxetine  (PAXIL ) tablet 20 mg (has no administration in time range) pyridoxine  (vitamin B6) (Vitamin B-6) tablet 100 mg (100 mg Oral Given 07/02/24 1042) calcium  carbonate (TUMS E-X) chewable tablet 1,500 mg of salt (has no administration in time range) atorvastatin  (LIPITOR) tablet 40 mg (has no administration in time range) prochlorperazine (COMPAZINE) tablet 10 mg (has no administration in time range)   Or prochlorperazine (COMPAZINE) injection 10 mg (has no administration in time range) ondansetron  (ZOFRAN ) tablet 4 mg ( Oral See Alternative 07/02/24 1255)   Or ondansetron  (PF) (ZOFRAN ) injection 4 mg (4 mg Intravenous Given 07/02/24 1255) acetaminophen  (TYLENOL ) tablet 975 mg (has no administration in time range)   Or acetaminophen  (TYLENOL ) 160 mg/5 mL oral LIQUID 975 mg (has no administration in time range) melatonin tablet 3 mg (has no administration in time range)   Or melatonin disintegrating tablet 3 mg (has no administration in time range) vancomycin pharmacy consult (has no administration in time range) vancomycin (VANCOCIN) in 0.9% sodium chloride  IVPB 1 g/250 mL (has no administration in time range) insulin LISPRO (AdmeLOG, HumaLOG) injection  correction dose 0-12 Units ( Subcutaneous Not Given 07/02/24 1259) magnesium  sulfate 4 g/100 mL IVPB (4 g Intravenous New Bag 07/02/24 1149) gabapentin  (NEURONTIN ) capsule 900 mg (has no administration in time range) vancomycin (VANCOCIN) in 0.9% sodium chloride  IVPB 1.75 g/500 mL (0 g Intravenous Stopped 07/01/24 2128) ceFEPIme  (MAXIPIME ) 2 g in sodium chloride  0.9 % 100 mL IVPB (0 g Intravenous Stopped 07/01/24 1941) sodium chloride  0.9 % bolus 1,000 mL (0 mLs Intravenous Stopped 07/01/24 1947) ondansetron  (PF) (ZOFRAN ) injection 4 mg (4 mg Intravenous Given 07/01/24 2053) oxyCODONE  (ROXICODONE ) immediate release tablet 5 mg (5 mg Oral Given 07/01/24 2049) morphine  (MS CONTIN ) ER tablet 15 mg (15 mg Oral Given 07/01/24 2050) magnesium  sulfate 2 g/50 mL IVPB (0 g Intravenous Stopped 07/02/24 0010) acetaminophen  (TYLENOL ) tablet 975 mg (975 mg Oral Given 07/01/24 2130) iopamidol (ISOVUE-370) 76% injection 150 mL (150 mLs Intravenous Given 07/01/24 2117) gabapentin  (NEURONTIN ) capsule 600 mg (600 mg Oral Given 07/02/24 9370)   ED Clinical Impression  1. Other chest pain   2. Adenocarcinoma of liver (CMS/HHS-HCC)   3. Altered mental status, unspecified altered mental status type   4. Fever and chills   5. Tachycardia   6. Metastatic sarcoma (CMS/HHS-HCC)   7. 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 Bacteremia      ED Disposition  Admit    DISPO: Admission to oncology.  Patient updated on all labs and imaging results and informed of plans to admit to the hospital. All questions answered at this time. Patient agreeable with plan. Vital signs stable at this time.     Case discussed with attending  physician, Dr. Barry, who also evaluated this patient.   Raguel FABIENE Sabot, PA-C Emergency Medicine PA [facility]  Parts of this note may have been written using Dragon dictation. There may be spelling and/or grammatical errors and phrases that were mistakenly  interpreted by the software and missed in my review.      This note has been created using automated tools and reviewed for accuracy by AMELIA ROSE BALLESTEROS-EDWARDS.   AI Note Feedback-- Point of Care Survey   Sabot Raguel Ee, [place] 07/02/24 1307  "

## 2024-07-02 NOTE — Consults (Signed)
 "   Surgery Consult Note     Date of Service: 07/02/2024     Time: 1:21 AM  Consult Requested By: ED (Barry, Chyrl Coy, MD)  Reason for Consult: surgical site infection  History     History of Present Illness:   Jacqueline Warren is a 74 y.o. female (BMI 32.4 kg/m2) with past history notable for prior smoker (quit 1970), T2DM, HTN, HLD, chronic pancreatitis (last episode 02/2024), breast cancer stage 3 HER2 positive (2010) treated w/ chemotherapy and radiation c/b B/L UE lymphedema, and recently diagnosed metastatic pancreatic adenocarcinoma s/p open cholecystectomy (05/26/2024) complicated by surgical site infection managed with packing and antibiotics who presents to [facility] on 07/02/2024 with AMS and increased drainage from her abdominal incision.   The patient was in their usual state of health until two days ago when she developed increased drainage from her abdominal incision.  Drainage remains similar in quality to prior. She reports increased redness, with stable pain levels.  Her daughter who is present at bedside also reports confusion starting on 1/20. She was found to be febrile in the ED (39C).  She denies any chills, night sweats, nausea, vomiting, dysuria, or shortness of breath.  Last BM was on 1/20. Denies being on anticoagulation.  She received her first round of chemotherapy on 1/14.  At the time of this consult, her most recent vitals are: T (!) 39 C (102.2 F), HR (!) 122, BP (!) 121/94, RR 20. she is currently satting 93 % on RA. Physical exam notable for soft nontender nondistended abdomen with serous drainage from known wound dehiscence and minimal surrounding erythema.   Labs are notable for a WBC 3.8, Hgb 11.8, Lactate 1.0, Cr 0.8, Tbili 1.1, AST23, ALT18, AlkP 133*. Imaging notable for absence of discrete fluid collections in the abdomen and small air-containing collection near the opening in her incision.    Patient History: Past Medical History:   Diagnosis Date   Abnormal Pap smear    1990s: hyperplasia   Basal cell carcinoma    september 2020- removed from left leg   Breast cancer (CMS/HHS-HCC)    Cancer (CMS/HHS-HCC)    both breasts s/p bilateral lumpectomy   Class 2 severe obesity due to excess calories with serious comorbidity and body mass index (BMI) of 37.0 to 37.9 in adult 02/19/2023   Difficult intravenous access    Diverticulitis 2012   DM2 (diabetes mellitus, type 2) (CMS/HHS-HCC)    Elevated alkaline phosphatase level    Fibroid 1991   GERD (gastroesophageal reflux disease)    History of blood transfusion    History of bone density study 08/17/2008   History of bone density study 12/01/2010   History of chemotherapy    History of hysterectomy for benign disease    fibroid tx   History of radiation therapy    History of snoring    Patient endorses witnessed snoring, no sleep study   Hx of adenomatous colonic polyps 01/20/2019   Hyperlipidemia    Hypertension    Kidney stone    history, patient passed   Mass of adrenal gland (HHS-HCC)    Melanoma of back (CMS/HHS-HCC)    Dx 8016   Motion sickness    Other osteoporosis without current pathological fracture 04/05/2022   Pancreatic cyst (HHS-HCC)    Pneumococcal vaccination given 11/21/2010   PONV (postoperative nausea and vomiting)    OK if she receives meds in pre-op   Poor intravenous access    Wears glasses  Allergies  Allergen Reactions   Amoxicillin Itching    Patient took amoxicillin for dental procedure. Soon after developed itching and hand swelling.    Other Other (See Comments)    Other Reaction: Skin Blisters   Tegaderm [Transparent Dressings] Other (See Comments)    Other Reaction: Skin Blisters   Adhesive Rash   Clindamycin Nausea and Vomiting   Rosuvastatin Other (See Comments)    Caused kidney issues   Silver Other (See Comments)    Breaks skin down     Past Surgical History:  Procedure  Laterality Date   COLONOSCOPY  03/29/2000   Adenomatous Polyp   COLONOSCOPY  10/17/2004   PH Adenomatous Polyp   EGD  03/27/2007   No repeat per RTE   EGD  05/22/2007   No repeat per RTE   COLONOSCOPY  11/25/2009   PH Adenomatous Polyp: CBF 11/2014; Recall Ltr mailed 01/19/2015 (dw)   MASTECTOMY PARTIAL Left 02/13/2012   Procedure: MASTECTOMY PARTIAL;  Surgeon: Ernie Ginny Maillard, MD;  [facility]: [facility];  Service: General Surgery;  Laterality: Left;  this will also be a seroma excision of her left breast   MASTECTOMY PARTIAL Left 04/29/2013   Procedure: MASTECTOMY PARTIAL, this will be a left central quadrectomy and evacuation of breast seroma;  Surgeon: Ernie Ginny Maillard, MD;  Location: [facility];  Service: General Surgery;  Laterality: Left;   COLONOSCOPY  10/14/2015   Tubulovillous Adenoma: CBF 10/2018: Recall ltr mailed    COLONOSCOPY  11/05/2019   Tubular adenoma of the colon/PHx CP/Repeat 31yrs/TKT   UPPER GASTROINTESTINAL ENDOSCOPY N/A 07/22/2020   Procedure: UPPER EUS;  Surgeon: Elta Fonda Mt, MD;  Location: [facility];  Service: Gastroenterology;  Laterality: N/A;   UPPER GASTROINTESTINAL ENDOSCOPY N/A 07/01/2021   Procedure: UPPER EUS;;  Surgeon: Elta Fonda Mt, MD;  Location: [facility];  Service: Gastroenterology;  Laterality: N/A;   UPPER GASTROINTESTINAL ENDOSCOPY N/A 07/04/2022   Procedure: UPPER EUS;  Surgeon: Elta Fonda Mt, MD;  Location: [facility];  Service: Gastroenterology;  Laterality: N/A;   UPPER GASTROINTESTINAL ENDOSCOPY N/A 12/24/2023   Procedure: ESOPHAGOGASTRODUODENOSCOPY, FLEXIBLE, TRANSORAL; W/TRANSENDOSCOPIC ULTRASOUND-GUIDED INTRAMURAL OR TRANSMURAL FINE NEEDLE ASPIRATION/BIOPSY, (ESOPHAGUS, STOMACH OR DUODENUM, AND ADJACENT STRUCTURES);  Surgeon: Jowell, Paul Simon, MD;  Location: [facility];  Service: Gastroenterology;  Laterality: N/A;   UPPER GASTROINTESTINAL ENDOSCOPY N/A  03/07/2024   Procedure: ESOPHAGOGASTRODUODENOSCOPY, FLEXIBLE, TRANSORAL; W/TRANSENDOSCOPIC ULTRASOUND-GUIDED INTRAMURAL OR TRANSMURAL FINE NEEDLE ASPIRATION/BIOPSY, (ESOPHAGUS, STOMACH OR DUODENUM, AND ADJACENT STRUCTURES);  Surgeon: Jama Fonda Ruse, MD;  Location: [facility];  Service: Gastroenterology;  Laterality: N/A;   ENDOSCOPIC RETROGRADE CHOLANGIO-PANCREATOGRAPHY W/BILIARY TUBE/STENT N/A 04/07/2024   Procedure: ERCP - Endoscopic Retrograde Cholangiopancreatography;  Surgeon: Elta Fonda Mt, MD;  Location: [facility];  Service: Gastroenterology;  Laterality: N/A;  2x stent   ENDOSCOPY OF BILIARY DUCT  04/07/2024   Procedure: ENDOSCOPIC CATHETERIZATION OF THE BILIARY DUCTAL SYSTEM, RADIOLOGICAL SUPERVISION AND INTERPRETATION;  Surgeon: Elta Fonda Mt, MD;  Location: [facility];  Service: Gastroenterology;;   EXPLORATORY LAPAROTOMY N/A 05/26/2024   Procedure: EXPLORATORY LAPAROTOMY, EXPLORATORY CELIOTOMY WITH OR WITHOUT BIOPSY(S) (SEPARATE PROCEDURE);  Surgeon: Blazer, Dan G III, MD;  [facility];  Service: General Surgery;  Laterality: N/A;   BIOPSY LIVER OPEN N/A 05/26/2024   Procedure: BIOPSY OF LIVER, WEDGE;  Surgeon: Claude Rolan KANDICE DOUGLAS, MD;  Location: [facility];  Service: General Surgery;  Laterality: N/A;   CHOLECYSTECTOMY N/A 05/26/2024   Procedure: CHOLECYSTECTOMY;  Surgeon: Claude,  Rolan KANDICE MOULD, MD;  Location: [facility];  Service: General Surgery;  Laterality: N/A;   basal cell removal of left leg     sept. 2020   BREAST BIOPSY     BREAST LUMPECTOMY     HYSTERECTOMY     Complete - 2001   OOPHORECTOMY     POLYPECTOMY     seroma removal 02/2012     TONSILLECTOMY     TUBAL LIGATION     Family History  Problem Relation Name Age of Onset   Irregular Heart Beat (Arrhythmia) Mother Naomie Ishihara    Heart disease Mother Naomie Ishihara    High blood pressure (Hypertension) Mother Naomie Ishihara     Osteoporosis (Thinning of bones) Mother Naomie Ishihara    Glaucoma Mother Naomie Ishihara    Stroke Mother Naomie Ishihara        passed 10/10/2012   PONV Mother Naomie Ishihara    Anesthesia problems Mother Naomie Ishihara        Would get sick   High blood pressure (Hypertension) Father Aida Ishihara    Myocardial Infarction (Heart attack) Father Aida Ishihara    Diabetes type II Father Aida Ishihara    Cancer Father Aida Ishihara    Stroke Father Aida Ishihara    Prostate cancer Father Aida Ishihara    Skin cancer Father Aida Ishihara    Anesthesia problems Sister Levorn Reams , sister        Would get sick   Diabetes type II Sister Levorn Reams , sister    Hyperlipidemia (Elevated cholesterol) Sister Levorn Reams , sister    PONV Sister Levorn Reams , sister    PONV Sister Rhoda Ishihara    Anesthesia problems Sister Rhoda Ishihara    Breast cancer Sister Rhoda Ishihara    Osteoporosis (Thinning of bones) Maternal Grandmother Pat Blush    Osteoporosis (Thinning of bones) Paternal Grandmother Lola Hinshaw    Thyroid disease Paternal Grandmother Lola Hinshaw    No Known Problems Daughter     High blood pressure (Hypertension) Maternal Uncle     Breast cancer Paternal Aunt     Breast cancer Paternal Aunt Alfonse Ada    Breast cancer Cousin     Breast cancer Cousin     Malignant hyperthermia Neg Hx      Social History   Tobacco Use   Smoking status: Former    Current packs/day: 0.00    Average packs/day: 0.3 packs/day for 2.0 years (0.5 ttl pk-yrs)    Types: Cigarettes    Start date: 10/25/1966    Quit date: 10/24/1968    Years since quitting: 55.7    Passive exposure: Past   Smokeless tobacco: Never  Substance Use Topics   Alcohol use: No       Home Medications:  Prior to Admission Medications  Prescriptions Last Dose Taking?  DENTA 5000 PLUS 1.1 %  No  Sig: Apply a thin ribbon to toothbrush and brush twice daily for 2 minutes. No  eating or drinking for 30 minutes after.  KLOR-CON M10 10 mEq ER tablet  No  Sig: TAKE 2 TABLETS (20 MEQ TOTAL) BY MOUTH ONCE DAILY 3 TIMES A WEEK  Patient taking differently: Take 2 tablets by mouth every Monday, Wednesday, and Friday  ONETOUCH VERIO TEST STRIPS test strip  No  Sig: ONCE DAILY FOR 196 DAYS USE AS INSTRUCTED.  PARoxetine  (PAXIL ) 20 MG tablet  No  Sig: TAKE 1 TABLET BY MOUTH EVERY DAY  Patient  taking differently: Take 20 mg by mouth at bedtime  acetaminophen  (TYLENOL ) 500 mg capsule  No  Sig: Take 500 mg by mouth every 6 (six) hours as needed for Pain  atorvastatin  (LIPITOR) 40 MG tablet  No  Sig: TAKE 1 TABLET BY MOUTH EVERY DAY  Patient taking differently: Take 40 mg by mouth every evening  blood glucose diagnostic test strip  No  Sig: Use once daily. Use as instructed.  calcium  carbonate (TUMS E-X) 300 mg (750 mg) chewable tablet  No  Sig: Take 600 mg of elemental by mouth every 2 (two) hours as needed for Heartburn  carvediloL  (COREG ) 25 MG tablet  No  Sig: TAKE 1 TABLET BY MOUTH TWICE A DAY WITH FOOD  Patient taking differently: Take 1 tablet by mouth 2 (two) times daily as needed  cholecalciferol (VITAMIN D3) 1000 unit capsule  No  Sig: Take 2,000 Units by mouth every morning  diabetic supplies, miscellan. Misc  No  Sig: Use  esomeprazole (NEXIUM) 40 MG DR capsule  No  Sig: Take 1 capsule (40 mg total) by mouth once daily  ezetimibe  (ZETIA ) 10 mg tablet  No  Sig: TAKE 1 TABLET BY MOUTH EVERY DAY  Patient taking differently: Take 10 mg by mouth every evening  famotidine (PEPCID) 10 MG tablet  No  Sig: Take 10 mg by mouth at bedtime  gabapentin  (NEURONTIN ) 300 MG capsule  No  Sig: Take 3 capsules by mouth 3 (three) times daily  lactose-reduced food (BOOST HIGH PROTEIN ORAL)  No  Sig: Take by mouth 2 (two) times daily  lancets (ONETOUCH DELICA LANCETS) 30 gauge Misc  No  Sig: Inject 1 each subcutaneously once daily  lidocaine -prilocaine (EMLA) cream  No  Sig:  Apply to port a cath site approximately 30 minutes prior to use.  lipase-protease-amylase, pork, (CREON) 24,000-76,000-120,000 unit DR capsule  No  Sig: Take 2 capsules by mouth 3 (three) times daily with meals  loratadine (CLARITIN) 10 mg tablet  No  Sig: Take 1 tablet by mouth once daily  magnesium  oxide (MAG-OX) 400 mg (241.3 mg magnesium ) tablet  No  Sig: Take 1 tablet (400 mg total) by mouth once daily  Patient taking differently: Take 1 tablet by mouth daily with lunch  metFORMIN (GLUCOPHAGE-XR) 500 MG XR tablet  No  Sig: Take 1 tablet by mouth daily with dinner  morphine  (MS CONTIN ) 15 MG ER tablet  No  Sig: Take 1 tablet (15 mg total) by mouth every 12 (twelve) hours for 30 days  ondansetron  (ZOFRAN ) 8 MG tablet  No  Sig: Take 1 tablet (8 mg total) by mouth every 12 (twelve) hours as needed for Nausea or Vomiting  ondansetron  (ZOFRAN -ODT) 4 MG disintegrating tablet  No  Sig: One po bid or as directed by gastroenterology  Patient not taking: Reported on 06/27/2024  oxyCODONE  (ROXICODONE ) 5 MG immediate release tablet  No  Sig: Take 1 tablet (5 mg total) by mouth every 4 (four) hours as needed for Pain for up to 5 days  oxyCODONE  (ROXICODONE ) 5 MG immediate release tablet  No  Sig: Take 1 tablet (5 mg total) by mouth every 4 (four) hours as needed for Pain  prochlorperazine (COMPAZINE) 10 MG tablet  No  Sig: Take 1 tablet (10 mg total) by mouth every 6 (six) hours as needed for Nausea  pyridoxine , vitamin B6, (VITAMIN B-6) 100 MG tablet  No  Sig: Take 100 mg by mouth once daily  valsartan (DIOVAN) 320 MG tablet  No  Sig: Take 1 tablet (320 mg total) by mouth once daily  Patient not taking: Reported on 06/27/2024    Facility-Administered Medications: None    Inpatient medications:  Current Facility-Administered Medications  Medication Dose Route Frequency Provider Last Rate Last Admin   dextrose 50% in water solution 12.5-25 g  12.5-25 g Intravenous As Directed Borawski, Fairy Rogue, MD       glucagon (GLUCAGEN) injection 1 mg  1 mg Intramuscular As Directed Jillyn Fairy Rogue, MD       lidocaine  (XYLOCAINE ) 1 % injection 0.5 mL  0.5 mL Subcutaneous As Directed Jillyn Fairy Rogue, MD       Current Outpatient Medications  Medication Sig Dispense Refill   acetaminophen  (TYLENOL ) 500 mg capsule Take 500 mg by mouth every 6 (six) hours as needed for Pain     atorvastatin  (LIPITOR) 40 MG tablet TAKE 1 TABLET BY MOUTH EVERY DAY (Patient taking differently: Take 40 mg by mouth every evening) 100 tablet 3   blood glucose diagnostic test strip Use once daily. Use as instructed. 100 each 12   calcium  carbonate (TUMS E-X) 300 mg (750 mg) chewable tablet Take 600 mg of elemental by mouth every 2 (two) hours as needed for Heartburn     carvediloL  (COREG ) 25 MG tablet TAKE 1 TABLET BY MOUTH TWICE A DAY WITH FOOD (Patient taking differently: Take 1 tablet by mouth 2 (two) times daily as needed) 180 tablet 3   cholecalciferol (VITAMIN D3) 1000 unit capsule Take 2,000 Units by mouth every morning     DENTA 5000 PLUS 1.1 % Apply a thin ribbon to toothbrush and brush twice daily for 2 minutes. No eating or drinking for 30 minutes after.     diabetic supplies, miscellan. Misc Use     esomeprazole (NEXIUM) 40 MG DR capsule Take 1 capsule (40 mg total) by mouth once daily 90 capsule 3   ezetimibe  (ZETIA ) 10 mg tablet TAKE 1 TABLET BY MOUTH EVERY DAY (Patient taking differently: Take 10 mg by mouth every evening) 100 tablet 3   famotidine (PEPCID) 10 MG tablet Take 10 mg by mouth at bedtime     gabapentin  (NEURONTIN ) 300 MG capsule Take 3 capsules by mouth 3 (three) times daily     KLOR-CON M10 10 mEq ER tablet TAKE 2 TABLETS (20 MEQ TOTAL) BY MOUTH ONCE DAILY 3 TIMES A WEEK (Patient taking differently: Take 2 tablets by mouth every Monday, Wednesday, and Friday) 78 tablet 2   lactose-reduced food (BOOST HIGH PROTEIN ORAL) Take by mouth 2 (two) times daily      lancets (ONETOUCH DELICA LANCETS) 30 gauge Misc Inject 1 each subcutaneously once daily 100 each 2   lidocaine -prilocaine (EMLA) cream Apply to port a cath site approximately 30 minutes prior to use. 30 g 2   lipase-protease-amylase, pork, (CREON) 24,000-76,000-120,000 unit DR capsule Take 2 capsules by mouth 3 (three) times daily with meals 180 capsule 0   loratadine (CLARITIN) 10 mg tablet Take 1 tablet by mouth once daily     magnesium  oxide (MAG-OX) 400 mg (241.3 mg magnesium ) tablet Take 1 tablet (400 mg total) by mouth once daily (Patient taking differently: Take 1 tablet by mouth daily with lunch) 100 tablet 3   metFORMIN (GLUCOPHAGE-XR) 500 MG XR tablet Take 1 tablet by mouth daily with dinner     morphine  (MS CONTIN ) 15 MG ER tablet Take 1 tablet (15 mg total) by mouth every 12 (twelve) hours for 30 days 60  tablet 0   ondansetron  (ZOFRAN ) 8 MG tablet Take 1 tablet (8 mg total) by mouth every 12 (twelve) hours as needed for Nausea or Vomiting 60 tablet 2   ondansetron  (ZOFRAN -ODT) 4 MG disintegrating tablet One po bid or as directed by gastroenterology (Patient not taking: Reported on 06/27/2024) 60 tablet 11   ONETOUCH VERIO TEST STRIPS test strip ONCE DAILY FOR 196 DAYS USE AS INSTRUCTED. 100 strip 3   oxyCODONE  (ROXICODONE ) 5 MG immediate release tablet Take 1 tablet (5 mg total) by mouth every 4 (four) hours as needed for Pain for up to 5 days 15 tablet 0   oxyCODONE  (ROXICODONE ) 5 MG immediate release tablet Take 1 tablet (5 mg total) by mouth every 4 (four) hours as needed for Pain 90 tablet 0   PARoxetine  (PAXIL ) 20 MG tablet TAKE 1 TABLET BY MOUTH EVERY DAY (Patient taking differently: Take 20 mg by mouth at bedtime) 90 tablet 4   prochlorperazine (COMPAZINE) 10 MG tablet Take 1 tablet (10 mg total) by mouth every 6 (six) hours as needed for Nausea 60 tablet 2   pyridoxine , vitamin B6, (VITAMIN B-6) 100 MG tablet Take 100 mg by mouth once daily     [Paused] valsartan  (DIOVAN) 320 MG tablet Take 1 tablet (320 mg total) by mouth once daily (Patient not taking: Reported on 06/27/2024) 90 tablet 3    Review of Systems: All ROS reviewed. Pertinent positive and negative findings per the HPI, all other systems negative.  Physical Exam    Vital signs in last 24 hours: Temp:  [36 C (96.8 F)-39 C (102.2 F)] 36 C (96.8 F) Heart Rate:  [96-122] 96 Resp:  [15-31] 17 BP: (95-137)/(52-94) 95/52 Temp (24hrs), Avg:37.8 C (100 F), Min:36 C (96.8 F), Max:39 C (102.2 F)  SpO2: 94 %   No intake/output data recorded. There is no height or weight on file to calculate BMI.  Physical Exam:  General: alert, cooperative, appears stated age, in NAD   Cardiovascular: RRR, well perfused, no JVD Respiratory: nonlabored breathing on RA Abdomen: soft, non-tender, non-distended, serous drainage from midline incision wound with minimal surrounding erythema Extremities: no lower extremity peripheral edema, extremities well-perfused. Skin: warm and well-perfused, port site intact without surrounding erythema     Data    Chemistry  Recent Labs  Lab 07/01/24 1855  NA 134*  133*  K 3.5  CL 96*  CO2 27  BUN 5*  CREATININE 0.8  GLUCOSE 139  CALCIUM  8.6*  MG 1.1*   CBC  Recent Labs  Lab 07/01/24 1855  WBC 3.8  HGB 11.8  HCT 35.7  PLT 137*   Hepatic Function  Recent Labs  Lab 07/01/24 1855  TBILI 1.1  AST 23  ALT 18  ALKPHOS 133*  ALB 2.7*   Coagulation  Recent Labs  Lab 07/01/24 1855  INR 1.3*  APTT 27.9     Lab Results  Component Value Date   PHUR 7.5 01/19/2009   SPECGRAV 1.020 06/13/2024   GLUCOSEU Negative 06/13/2024   BLOODU Negative 06/13/2024   KETONESU 1+ (!) 06/13/2024   UROBILINOGEN 4.0 (H) 06/13/2024   BILIRUBINUR 2+ (!) 06/13/2024   LEUKOCYTESUR Trace (!) 06/13/2024   NITRITE Negative 06/13/2024    Radiology Studies CT abdomen pelvis on 07/01/2024: Roxianne Orren Brooks, MD on 07/01/2024  9:40 PM EST [facility]  Radiology Virtual, Radiology)  Postsurgical changes of Whipple with ill-defined stranding/fluid within the former region of the pancreatic head. These findings are favored postsurgical and  there is no discrete fluid collection identified, though pancreatitis can appear similarly. Correlate with white count and lipase.  Post surgical changes of the anterior abdominal wall with small air-containing collection measuring up to 2.9 cm.  Please see same-day CT chest for findings above the diaphragm.   CT brain on 07/01/2024: IMPRESSION:  No acute intracranial abnormalities.   CT PE on 1/20/2026L  Joya Sosa, Yocelin Ivette, MD on 07/01/2024  9:47 PM EST [facility] Radiology Virtual, Radiology)  Suboptimal exam secondary to bolus timing and arm position. No evidence of acute pulmonary embolism to the level of the segmental pulmonary arteries. Right thyroid lobe nodule measuring up to 1.2 cm. If not  previously done and within the goals of care consider dedicated ultrasound for further evaluation.. Please refer to same day CT abdomen and pelvis for findings below the diaphragm.    Assessment & Recommendations     Ms. Finnell is a 74 y.o. female with past history notable for prior smoker (quit 1970), T2DM, HTN, HLD, chronic pancreatitis (last episode 02/2024), breast cancer stage 3 HER2 positive (2010) treated w/ chemotherapy and radiation c/b B/L UE lymphedema, and recently diagnosed metastatic pancreatic adenocarcinoma s/p open cholecystectomy (05/26/2024) complicated by surgical site infection managed with packing and antibiotics who presents with AMS, increased drainage from her abdominal incision, fever.   CT scan and exam are reassuring versus abdominal wound being source of infection.  - Agree with admission to medical oncology service - Continued infectious work-up - Recommend broad spectrum IV antibiotics to cover possible intra-abdominal source of infection - Serial abdominal exams - PRN dressing  changes to wound clean and dry(using dry gauze and ABD pad) - Surgical oncology will continue to follow   Code Status: Prior  Discharge Planning: pending clinical progress  Thank you for allowing us  to participate in the care of this patient.  Please feel free to contact us  with any questions or concerns.  This patients case has been discussed with Dr. Claude who helped formulate the plan of care. Critical modifications to the plan will be communicated directly to the primary team.  Deloras Mas, MD Vascular Surgery PGY-3  General/Vascular Surgery ED Consults: [phone] General/Vascular Surgery Inpatient Consults [phone] Vascular Surgery Service [phone] Transplant Surgery Service  [phone]  Attestation Statement:   I personally saw and evaluated the patient, and participated in the management and treatment plan as documented in the resident/fellow note.  DAN G BLAZER, MD   We will continue to follow her wound.  Intra-abdominally, everything looks ok to me.  Unlikely to be source of her fevers.  Blazer    "

## 2024-07-02 NOTE — Consults (Signed)
 [facility] Brief Consult Note   3:44 PM Addendum: Spoke with primary team who notes that patient will lose IV access with removal of right chest wall port. The patient is refusing attempts at PIV access given bilateral axillary lymph node dissections ISO prior breast cancer. The primary expects patient to require at least 2 weeks of IV antibiotics and requests tunneled central line.   Jacqueline Warren is a 74 y.o. 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  Plan to proceed with the requested procedure with an anticipated procedure date of today (07/02/2024) . Please see forthcoming separate pre-procedure H&P for further details.  In the interim, in preparation for this procedure, please: - Keep the patient NPO until the procedure. - As this is a low risk bleeding procedure*, anticoagulation (if already ordered) does not need to be held.  Please reference the original consult order, which is attached to this consult note, for further details as specified by the ordering provider.  Please page the on-call IR Resident/Fellow with any questions or concerns at [phone].   ERNIE JAYSON JEWETT, MD   * Tobie FLORAS, Linward GORMAN Kristene JOLYNN, et al. Society of Interventional Radiology Consensus Guidelines for the Periprocedural Management of Thrombotic and Bleeding Risk in Patients Undergoing Percutaneous Image-Guided Interventions-Part II: Recommendations: Endorsed by the [place] Association for Interventional Radiology and the Cardiovascular and Interventional Radiological Society of [place]. J Vasc Interv Radiol. 2019;30(8):1168-1184.e1. doi:10.1016/j.jvir.2019.04.017  Additional [facility] V[facility]-specific peri-procedural guidelines can be found here:  [email or web address]  ------------------------------------------------------------------------------- Attestation signed by Gladis Dorn Forte, MD at 07/03/2024  8:56 PM Attestation Statement:   This service was rendered under my overall direction and control, and I was immediately available via phone/pager or present on site.  JONATHAN GILBERT MARTIN, MD  -------------------------------------------------------------------------------

## 2024-07-02 NOTE — Consults (Signed)
 [facility] Brief Consult Note  Jacqueline Warren is a 74 y.o. female with PMH significant for metastatic pancreatic adenocarcinoma s/p aborted [facility] on 06/14/2023 with post op course complicated by infection for whom IR has been consulted for CVC placement. Plan to proceed with the requested procedure with an anticipated procedure date of today (07/02/2024) . Please see forthcoming separate pre-procedure H&P for further details.  In the interim, in preparation for this procedure, please: - Keep the patient NPO until the procedure. - As this is a low risk bleeding procedure*, anticoagulation (if already ordered) does not need to be held.   Please reference the original consult order, which is attached to this consult note, for further details as specified by the ordering provider.  Please page the on-call IR Resident/Fellow with any questions or concerns at [phone].   TREVOR ROSINA DINES, MD   * Tobie FLORAS, Linward GORMAN Kristene JOLYNN, et al. Society of Interventional Radiology Consensus Guidelines for the Periprocedural Management of Thrombotic and Bleeding Risk in Patients Undergoing Percutaneous Image-Guided Interventions-Part II: Recommendations: Endorsed by the [place] Association for Interventional Radiology and the Cardiovascular and Interventional Radiological Society of [place]. J Vasc Interv Radiol. 2019;30(8):1168-1184.e1. doi:10.1016/j.jvir.2019.04.017  Additional [facility] VIR-specific peri-procedural guidelines can be found here: [email or web address]

## 2024-07-03 NOTE — Progress Notes (Signed)
 " Medical Oncology Daily Progress Note July 03, 2024 Hospital Day: 3  PATIENT IDENTIFICATION: Jacqueline Warren is a 74 y.o. female with PMHx HTN, HLD, T2DM, breast cancer (2010) s/p chemo/ RT, and recent dx metastatic pancreatic cancer (aborted whipple in 06/2023 in light of liver metastases; s/p cholecystectomy) with post op course c/b infection, wound being packed, presenting 1/20 with Fever of unknown origin (FUO)  HOSPITAL SYNOPSIS: 1/20-> admitted to 9300; mild confusion (resolved at time of admission); febrile to 102.2; HR 122; blood cultures obtained; started on Cefepime / flagyl / Vanc; eRVP negative; CTH negative; CT a/p post surgical changes; CT chest neg for PE/ 1.1cm R thyroid nodule 1/21-> 2 of 2 blood cultures growing GPC; echo neg for IE; IR consulted- port removed and CVC placed (for access; previous b/l axillary node dissections precluding PIV); platelets 108k  INTERVAL HISTORY:  No acute events overnight Afebrile and HDS S/p port removal and CVC placement yesterday Remains on Cefepime / Flagyl / Vanc (1/20-p) 1/20 blood cultures growing GPC; awaiting additional culture data Denies HA, chest pain, shortness of breath, abdominal pain, nausea Last BM this morning  PHYSICAL EXAMINATION:   Current Vital Signs 24h Vital Sign Ranges  T 37.2 C (99 F) (07/03/24 0820) Temp  Avg: 37.1 C (98.8 F)  Min: 36.4 C (97.6 F)  Max: 37.7 C (99.9 F)  BP 132/72 (07/03/24 0820) BP  Min: 120/52  Max: 188/67  HR 92 (07/03/24 0432) Pulse  Avg: 96.7  Min: 88  Max: 105  RR 14 (07/03/24 0820) Resp  Avg: 17.6  Min: 8  Max: 26  O2sat 92 % Nasal cannula (07/02/24 1622) SpO2  Avg: 96.8 %  Min: 92 %  Max: 99 %       Current Shift Ins/Outs 24h Total Ins/Outs  Time Ins Outs No intake/output data recorded. 01/21 0701 - 01/22 0700 In: 2403 [P.O.:730; I.V.:857.3] Out: 305 [Urine:305]       Weights   First 85.8 kg (189 lb 1.6 oz) (07/02/24 0914)   Last 85.8 kg (189 lb 1.6 oz) (07/02/24 0914)    GEN:  NAD; pleasant/ cooperative HEENT: PERRL, EOMI, MMM, OP clear, no mucositis appreciated NECK:  Supple, no JVD, no LAD LUNGS:  normal WOB; clear anteriorly; no accessory muscle use CV:  RRR; normal s1s2; no murmur appreciated ABD:  soft; midline incision  with small open area/ some serous drainage; mild erythema/ no induration; dressing in place EXT:  no edema; WWP SKIN:  no rash or petechiae; previous R anterior chest wall port site well approximated; R CVC in place- dressing intact NEURO:  A&Ox3, CN II-XII intact grossly, no gross focal deficit   ASSESSMENT AND PLAN:    Principal Problem:   Fever of unknown origin (FUO) Active Problems:   Hx of breast cancer   HTN (hypertension)- CARDIOLOGY MANAGES   Hyperlipidemia   GERD (gastroesophageal reflux disease)   Peripheral neuropathy   DM2 (diabetes mellitus, type 2) (CMS/HHS-HCC)   Drug-induced polyneuropathy (HHS-HCC)   IPMN (intraductal papillary mucinous neoplasm)   Adenocarcinoma of liver (CMS/HHS-HCC)   Pancreatic cancer metastasized to liver (CMS/HHS-HCC)   MDD (major depressive disorder)   Open abdominal wall wound   Encephalopathy acute, unspecified   Cancer related pain   Active Problems  #GPC bacteremia #fever Pt presented with 1 day increased drainage from surgical abdominal site / transient confusion leading to ED visit.  Noted to be febrile to 102 with associated tachycardia (120s).  Lactate flat; eRVP negative.  CT chest negative for PE; notable  for 1.1cm R thyroid nodule (normal TFTs).  Blood cultures obtained in ED and started on Cefepime / flagyl / Vanc.  Suspect bacteremia related to port placement vs access vs other IR consulted s/p port removal (1/21) and CVC placement (unable to place PIV in setting of previous b/l axillary node dissection) -Cefepime / Vanc/ flagyl  (1/20-p).   -f/u 1/20 blood cultures/ speciation  -rpt cultures tomorrow, 1/23 -abx plan pending above-> suspect 2 week course from negative  culture  #surgical wound (midline) 12/15 aborted whipple in light of liver metastases; however, open cholecystectomy pursued with post op course c/b surgical site infection managed with packing/ abx (doxycycline ).  Some increased drainage one day prior to admission. Imaging stable.  -e-mail update Dr. Jules team -surgical team following; appreciate input   *con't broad spectrum abx   *BID wound packing with dry gauze   #metastatic pancreatic cancer #cancer related pain Outpatient Oncologist: Dr. Kingston Diagnosis Date: 05/2024 Metastasis (if applicable): liver Number of prior lines of therapy (if applicable): on 1st line gem/ abraxane Last Treatment Date: C1D1 on 06/25/2024 Surgical History: aborted whipple (liver mets) on 12/15; cholecystectomy pursued (Dr. Claude) Radiation History: none  -outpatient team e-mail updated -con't MS Contin  15mg  q 12 hours -con't oxycodone  5mg  po q 4 hours prn -creon -PT/ OT consulted-> recommendation for HHPT/ OT pending progress (as of 1/21)  #h/o Stg 3 HER2+ breast cancer (2010) S/o lumpectomy/ chemoradiation Post treatment course c/b neuropathy -con't home gabapentin  900mg  TID-> pt with persistent neuropathy; has been on gabapentin  for 15 years.  Consider trial of pregabalin  #HLD -con't home atorvastatin  40mg  qhs -con't home zetia  10mg  qhs  #MDD -con't home paroxetine  20mg  qhs  #GERD Esomeprazole prior to admission -protonix  40mg  daily  #T2DM Holding home metformin  -SSI with POC glucose checks  #FEN -Electrolytes monitored daily and supplemented as needed     Diet:  Active Orders  Diet   Diet carbohydrate level 2 (60 gm/meal)    VTE Prophylaxis: hold pharmacologic ppx in light of down trending platelets (monitor and resume as able)  Code Status:Full Code    Discharge Planning: pending clinical course; PT/ OT rec for HHPT/OT (1/21)  CHRISTINA MARIE [facility] Inpatient Medical Oncology Available via  pager 07/03/2024 9:14 AM ___________________________________________________   Labs: Recent Labs  Lab 07/01/24 1855 07/02/24 0634 07/03/24 0426  NA 134*  133* 133* 135  K 3.5 3.6 3.3*  CO2 27 25 29   BUN 5* 6* 8  CREATININE 0.8 0.6 0.6  GLUCOSE 139 131 132  CALCIUM  8.6* 8.1* 8.1*  MG 1.1* 1.6* 1.9   Recent Labs  Lab 07/01/24 1855 07/02/24 0634 07/03/24 0426  ALT 18 14 13   AST 23 24 18   ALKPHOS 133* 108 99  TBILI 1.1 1.2 0.6  ALB 2.7* 2.3* 2.0*     Recent Labs  Lab 07/01/24 1855 07/02/24 0634 07/03/24 0418  WBC 3.8 5.5 4.9  HGB 11.8 9.7* 8.6*  HCT 35.7 29.6* 26.5*  PLT 137* 108* 86*   Recent Labs  Lab 07/01/24 1855  APTT 27.9  INR 1.3*      Microbiology: Lab Results  Component Value Date   BLDCULT Positive Blood Culture; Identification To Follow (!) 07/01/2024   BLDCULT (!) 07/01/2024    Positive blood culture, see molecular testing results below    1/21 eRVP negative MRSA swab pending    Imaging: IR central venous catheter removal Result Date: 07/02/2024 EXAM: Tunneled central venous catheter placement INDICATION: GPC bacteremia Losign only access with  port removal, needs central tuneled for abx Bacteremia [R78.81 (ICD-10-CM)] Bacteremia [R78.81 (ICD-10-CM)] SEDATION / ANESTHESIA / MEDICATION: Level of sedation/anesthesia: Moderate sedation  - the physician who performed the diagnostic/therapeutic procedure provided 40 minutes of continuous face to face moderate sedation services for this procedure with the assistance of an independent trained observer who had no other duties during the procedure.  Sedation / Anesthesia / Medication Administered:  midazolam  (VERSED ) 1 mg/mL injection - 2 mg fentaNYL  (PF) (SUBLIMAZE ) injection - 150 mcg (Totals for administrations occurring from 1619 to 1717 on 07/02/24) FLUOROSCOPY DOSE: Total fluoroscopy time: 2.08 minutes Dose area product: 4.514 mGy-cm2 Air kerma: 10.38 mGy COMPLICATIONS: None immediate PREPROCEDURE:  Informed written consent was obtained after a discussion of the risks, benefits, and alternatives to the procedure. A pre-procedure assessment was documented in the medical record. A Time-Out was performed immediately prior to the procedure. TECHNIQUE AND FINDINGS: The patient was positioned supine on the fluoroscopy table. The procedure site was prepped and draped in the usual sterile fashion. The target vein was visualized by ultrasound and found to be patent. An image was saved and sent to PACS. After anesthetizing the overlying skin and subcutaneous tissues with lidocaine , the vein was accessed under real-time ultrasound guidance using a micropuncture kit. A guidewire was advanced into the central veins under fluoroscopic guidance. A subcutaneous tract was anesthetized with lidocaine , and using a tunneling device the catheter was tunneled through the subcutaneous tissues and out the venotomy site. A peel-away sheath was advanced over the guidewire. The catheter was inserted through the peel-away sheath. A radiograph confirmed appropriate location of the catheter tip at the cavoatrial junction / right atrium. The catheter aspirated and flushed without difficulty. The catheter was anchored to the skin surface using a non-absorbable suture. The venotomy site was closed with skin glue.  The patient tolerated the procedure well. Attention was then directed to removal of the previously placed right chest wall port. The skin and subcutaneous tissues over the port were anesthetized, and an approximately 3 cm incision was made. The port was removed using a combination of sharp and blunt dissection. Hemostasis was achieved with manual pressure at the venotomy site. After irrigating the pocket with saline, the pocket was closed with absorbable suture and skin glue. The patient tolerated the procedure well.      Successful placement of a right internal jugular vein custom length 6 French double lumen powerline tunneled  central venous catheter. The catheter is ready for use. Removal of a right chest wall port. No surrounding fluid collection of signs of inflammation/soft tissue infection. Electronically reviewed by: LAMAR BERNARDINO GU, MD PHD I, Dorn Lunger, MD, attest that I was present for the key elements of the procedure and immediately available.  I reviewed the stored images and concur with the above findings.   IR central venous catheter placement Result Date: 07/02/2024 EXAM: Tunneled central venous catheter placement INDICATION: GPC bacteremia Losign only access with port removal, needs central tuneled for abx Bacteremia [R78.81 (ICD-10-CM)] Bacteremia [R78.81 (ICD-10-CM)] SEDATION / ANESTHESIA / MEDICATION: Level of sedation/anesthesia: Moderate sedation  - the physician who performed the diagnostic/therapeutic procedure provided 40 minutes of continuous face to face moderate sedation services for this procedure with the assistance of an independent trained observer who had no other duties during the procedure.  Sedation / Anesthesia / Medication Administered:  midazolam  (VERSED ) 1 mg/mL injection - 2 mg fentaNYL  (PF) (SUBLIMAZE ) injection - 150 mcg (Totals for administrations occurring from 1619 to 1717 on 07/02/24)  FLUOROSCOPY DOSE: Total fluoroscopy time: 2.08 minutes Dose area product: 4.514 mGy-cm2 Air kerma: 10.38 mGy COMPLICATIONS: None immediate PREPROCEDURE: Informed written consent was obtained after a discussion of the risks, benefits, and alternatives to the procedure. A pre-procedure assessment was documented in the medical record. A Time-Out was performed immediately prior to the procedure. TECHNIQUE AND FINDINGS: The patient was positioned supine on the fluoroscopy table. The procedure site was prepped and draped in the usual sterile fashion. The target vein was visualized by ultrasound and found to be patent. An image was saved and sent to PACS. After anesthetizing the overlying skin and  subcutaneous tissues with lidocaine , the vein was accessed under real-time ultrasound guidance using a micropuncture kit. A guidewire was advanced into the central veins under fluoroscopic guidance. A subcutaneous tract was anesthetized with lidocaine , and using a tunneling device the catheter was tunneled through the subcutaneous tissues and out the venotomy site. A peel-away sheath was advanced over the guidewire. The catheter was inserted through the peel-away sheath. A radiograph confirmed appropriate location of the catheter tip at the cavoatrial junction / right atrium. The catheter aspirated and flushed without difficulty. The catheter was anchored to the skin surface using a non-absorbable suture. The venotomy site was closed with skin glue.  The patient tolerated the procedure well. Attention was then directed to removal of the previously placed right chest wall port. The skin and subcutaneous tissues over the port were anesthetized, and an approximately 3 cm incision was made. The port was removed using a combination of sharp and blunt dissection. Hemostasis was achieved with manual pressure at the venotomy site. After irrigating the pocket with saline, the pocket was closed with absorbable suture and skin glue. The patient tolerated the procedure well.      Successful placement of a right internal jugular vein custom length 6 French double lumen powerline tunneled central venous catheter. The catheter is ready for use. Removal of a right chest wall port. No surrounding fluid collection of signs of inflammation/soft tissue infection. Electronically reviewed by: LAMAR BERNARDINO GU, MD PHD I, Dorn Lunger, MD, attest that I was present for the key elements of the procedure and immediately available.  I reviewed the stored images and concur with the above findings.   Echo complete Result Date: 07/02/2024               [facility]                         [place]                                                                                      [facility]                              [facility]                              [phone]  DOB: [birth year]  Age: 89                 ECHO-DOPPLER REPORT                             Date: 07/02/2024                                                                                 Female                                                                                    Inpatient                                                                       LOCATION: [facility]                                                                            MD1: CHRISTINA MARIE LYNCH        STUDY: ECHO COMPLETE                             SOUND QLTY: Poor                          ECHO: Yes                                           STRAIN: No                           COLOR: Yes                                               3D: No                         DOPPLER: Yes  BP: 152 / 65                 RV BIOPSY: No                                                HR: 102 BPM                   CONTRAST: No                                            Height: 63 in                       MEDIUM: N/A                                           Weight: 190 lbs                    MACHINE: CDU - PIO 13                                     BSA: 1.9                   ------------------------------------------------------------------------------------------     History: Malignancy and SOB      Reason: LV function  RV Function Indication: R50.9- Fever, unspecified.                                                          R07.89- Other chest pain.                                                           C22.9- Malignant neoplasm of liver, not specified as primary or secondary (CMS/HHS  HCC).                                                                               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 R00.0- Tachycardia, unspecified.                                                    R41.82- Altered mental status, unspecified.  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                         CONCLUSION ------------------------------------------------------------------------------- HYPERCONTRACTILE LEFT VENTRICULAR FUNCTION WITH NO LVH ESTIMATED EF: 55% INDETERMINATE DIASTOLIC FUNCTION NORMAL RIGHT VENTRICULAR SYSTOLIC FUNCTION VALVULAR REGURGITATION: No AR, TRIVIAL MR, No PR, TRIVIAL TR                             NO VALVULAR STENOSIS                                                                     ------------------------------------------------------------------------------------------ NO PERICARDIAL EFFUSION                                                                  Compared with prior Echo study on 08/18/2021 NO SIGNIFICANT CHANGES                                                                                          ECHOCARDIOGRAPHIC DESCRIPTIONS ----------------------------------------------------------- AORTIC ROOT         Asc Ao Size: Normal                               Dissection: INDETERMINATE FOR DISSECTION                                             AORTIC VALVE            Leaflets: Tricuspid                               Mobility: Fully Mobile                    Morphology: Normal                                                                                    AR: No AR  AS: No AS                              AV Mass: No Masses                   LEFT VENTRICLE                Size: Normal                                                                                   LVH: None                                Contraction: HYPERCONTRACTILE                     Closest EF: 55%                                                        LV Mass: No Masses                         Dias. FxClass: INDETERMINATE                                                            WALL MOTION                            Basal             Mid               Apical              Anterior Septum: Hyperkinetic      Hyperkinetic      Hyperkinetic          Anterior Wall: Hyperkinetic      Hyperkinetic      Hyperkinetic           Lateral Wall: Hyperkinetic      Hyperkinetic      Hyperkinetic         Posterior Wall: Hyperkinetic      Hyperkinetic                            Inferior Wall: Hyperkinetic      Hyperkinetic      Hyperkinetic        Inferior Septum: Hyperkinetic      Hyperkinetic                      Rest Rest Score Index: 1.00 MITRAL VALVE  Leaflets: Normal                                  Mobility: Fully Mobile                    Morphology: Normal                                                                                     MR: TRIVIAL MR                                    MS: No MS                            MV masses: No Masses                   LEFT ATRIUM                Size: Normal                                                                             LA masses: No Masses                   MAIN PA                Size: Not Seen                    PULMONIC VALVE            Leaflets: UNKNOWN                                 Mobility: Fully Mobile                    Morphology: Normal                                                           PR: No PR                                         PS: No PS                            PV masses: No Masses  RIGHT VENTRICLE                Size: Normal                                 Free Wall: Normal                         Contraction: Normal                                                    RV masses: No Masses                   TRICUSPID VALVE            Leaflets: Normal                                  Mobility: Fully Mobile                    Morphology: Normal                                        TR: TRIVIAL TR                                    TS: No TS                            TV masses: No Masses                   RIGHT ATRIUM                Size: SMALL                                 RA masses: No Masses                   PERICARDIUM               Fluid: No Effusion        INFERIOR VENA CAVA                Size: Normal                                 Max Diam: 1.7 cm                                  Min Diam: 0.6 cm                      Percent Change: 64 %  Resp.Collapse: Normal Respiratory Collapse                                          RESTING ECHOCARDIOGRAPHIC MEASUREMENTS --------------------------------------------------- AORTA Measurements            Values    Units     Normal Range                              Aorta Sin: 2.9       cm        [2.4 - 3.6]                               Asc.Aorta: 3.8       cm        [1.9 - 3.5]                          Asc. Aorta BSA: 2         cm/m2     [1.0 - 2.2]                  LEFT VENTRICLE                  LVIDd: 4.6       cm        [3.8 - 5.2]                                   LVIDs: 3         cm        [2.2 - 3.5]                               LVIDd/BSA: 2.5       cm/m2                                                     SWT: 0.9       cm        [0.6 - 0.9]                                     PWT: 0.9       cm        [0.6 - 0.9]                  DIASTOLIC FUNCTION          MV Pk. E Vel.: 50.1      cm/s                                            MV Pk. A Vel.: 105.3     cm/s  MV E/A: 0.5                                                               MV DT: 148       msec                                           MV Med E' Vel.: 5.5       cm/s                                              MV Med E/e': 9.1                                                       MV Lat E'Vel.: 7.6       cm/s                                               MV Lat E/e': 6.5                                                         MV Avg E/e': 7.6       cm/s                                   LEFT ATRIUM                LA Diam: 3.3       cm        [2.7 - 3.8]                                 LA Area: 14.7      cm2       [<= 20]                                   LA Volume: 32        ml        [18 - 58]                                      LAVi: 17        ml/m2     [16 - 34]  RIGHT VENTRICLE                RV Base: 2.6       cm        [2.5 - 4.1]                                  RV Mid: 2.6       cm        [1.9 - 3.5]                  RIGHT ATRIUM                RA Area: 8.5       cm2       [ <= 20]                                  RA Volume: 14        mL                                                       RAVi: 7         mL/m2     [15 - 27]                    INFERIOR VENA CAVA                Max.IVC: 1.7       cm        [ <= 2.1]                                   Min.IVC: 0.6       cm        [ <= 1.7]                            Percent Change: 64        %                                      Pressures, Gradients, and DOPPLER ECHO --------------------------------------------------- Mitral Valve          MV Pk. E Vel.: 50.1      cm/s                                            MV Pk. A Vel.: 105.3     cm/s                                                   MV E/A: 0.5  MV Inflow E Vel.: 50.1      cm/s                                        MV Annulus E'Vel.: 5.5       cm/s                                                E/E'Ratio: 9                                                Pulmonic Valve                  PV AT: 123       msec                                   Tricuspid Regurgitation Values            RA Pressure: 3         mmHg                                          Perform By: Epifanio Noa                                                             Res. Person: Susana Urena                                                        Electronically signed by Norleen VEAR Blunt, M.D. on:07/02/2024 2:18:17 PM with status of Final The images are stored in the [facility] system, please contact the clinical provider for images related to this study.                                                                      CT abdomen pelvis with contrast Result Date: 07/02/2024 CT abdomen and pelvis with IV contrast Comparison:  CT abdomen pelvis 04/04/2024. Indication:  Abdominal abscess/infection suspected, C22.9 Malignant neoplasm of liver, not specified as primary or secondary (CMS/HHS-HCC). Technique:  CT imaging was performed of the abdomen and pelvis following the administration of intravenous contrast.  Iodinated contrast was used due to the indications for the examination, to improve disease detection and further define anatomy. Coronal and sagittal reformatted images were generated and reviewed. Findings: - Lower Thorax:  Please see same-day CT chest for findings below the diaphragm. - Liver: Normal in morphology and enhancement.  No suspicious hepatic masses are identified.  The portal and hepatic veins are patent. - Biliary and Gallbladder: Postsurgical changes of Whipple with CBD stent in place. Status post cholecystectomy with similar mild intrahepatic ductal dilation, nonspecific. Prominent CBD, favored reservoir effect. - Spleen: Normal in appearance.  - Pancreas: Ill-defined soft tissue in fluid within the region of the pancreatic head is favored postoperative. Hypodense lesion within the pancreatic tail measuring 1.6 cm, similar dating back to 2015. - Adrenal Glands: Redemonstrated left adrenal mass measuring 3.4 x 2.4 cm, unchanged in size dating back to 07/10/23. - Kidneys: Symmetric in size and enhancement. Bilateral renal cysts. No hydronephrosis. - Abdominal and Pelvic Vasculature: No abdominal aortic aneurysm. - Gastrointestinal Tract: No abnormal dilation or wall thickening. Normal appendix. -  Peritoneum/Mesentery/Retroperitoneum: No free fluid.  No free intraperitoneal air. - Lymph Nodes: No retroperitoneal, mesenteric, pelvic, or inguinal lymphadenopathy.  - Bladder: Normal in appearance. - Pelvic Organs: Hysterectomy. - Body Wall: Postsurgical changes of the anterior abdomen with small air-containing fluid collection measuring 2.9 x 1.8 cm (3/90). - Musculoskeletal:  No aggressive appearing osseous lesions. Sacral Tarlov cyst. Impression: 1.  Postsurgical changes of Whipple with ill-defined stranding/fluid within the former region of the pancreatic head, favored postsurgical. No discrete fluid collection identified. 2.  Post surgical changes of the anterior abdominal wall with small air-containing collection measuring up to 2.9 cm, favored phlegmon. 3.  Please see same-day CT chest for findings above the diaphragm. The preliminary report (critical or emergent communication) was reviewed prior to this dictation and there are no substantial differences between the preliminary results and the impressions in this final report. Electronically Reviewed by:  Tessa Zangara, MD, [facility] Electronically Reviewed on:  07/02/2024 9:33 AM I have reviewed the images and concur with the above findings. Electronically Signed by:  Jennifer Bring, MD, [facility] Electronically Signed on:  07/02/2024 11:17 AM  CT chest PE protocol incl CT angiogram chest w wo contrast Result Date: 07/02/2024 CT CHEST PE  PROTOCOL INCL CT CHEST ANGIOGRAM W WO CONTRAST Indication: tachycardia, AMS, R41.82 Altered mental status, unspecified Comparison Exams:  CT chest 01/31/2012 and 11/29/2008 Protocol:  Chest CTA PE Protocol.  Contiguous axial images were obtained from the neck base through the upper abdomen following intravenous administration of iodinated contrast material.  If IV contrast material had not been administered, the likelihood of detecting abnormalities relevant to the patient's condition would have been substantially  decreased.  3-D reconstructions were likewise performed and indicated to increase the sensitivity of detecting clinically relevant pathology. FINDINGS: LOWER NECK: A 1.1 cm hypoattenuating right thyroid nodule (6/9) BONES/SOFT TISSUES: Right chest wall port catheter tip terminates at the superior cavoatrial junction. Partially visualized midline anterior abdominal scar. Postsurgical changes of the bilateral axilla. No suspicious bone lesion. VISIBLE ABDOMEN: For findings below the diaphragm, refer to separate CT abdomen pelvis report.. MEDIASTINUM/HEART/VESSELS: No mediastinal or hilar lymphadenopathy. Normal pericardium. Moderate coronary artery calcifications. Normal caliber aorta. Dilated main pulmonary artery measuring 3.8 cm. No pulmonary embolism to the level of the subsegmental pulmonary arteries. Limited evaluation of the subsegmental vessels secondary to contrast timing and patient's arm positioning. LUNGS/AIRWAYS/PLEURA: Patent central airways. Normal pleura. Left upper lobe 6 mm nodule is unchanged since 2013 (image 151, series 6). Other nodules up to 3 mm in size are also unchanged. IMPRESSION: 1.  No pulmonary embolism to the segmental level. Limited evaluation of the  subsegmental pulmonary arteries. 2.  Right thyroid nodule measuring 1.1 cm. Further evaluation with ultrasound on a nonemergent basis is recommended. 3.  For findings below the diaphragm, refer to separate CT abdomen pelvis report. Electronically Reviewed by:  Francie Galley, MD, [facility] Electronically Reviewed on:  07/02/2024 8:43 AM I have reviewed the images and concur with the above findings. Electronically Signed by:  Jayson Sprinkle, MD, [facility] Electronically Signed on:  07/02/2024 11:02 AM  CT brain without contrast Result Date: 07/01/2024 CT BRAIN WITHOUT CONTRAST INDICATION: AMS, R41.82 Altered mental status, unspecified COMPARISON: CT brain without contrast 07/28/2009 TECHNIQUE: Standard noncontrast brain CT.  FINDINGS: Brain Parenchyma: There is no hemorrhage, cerebral edema, acute cortical infarction, mass, mass effect, or midline shift. Moderate periventricular and deep white matter hypoattenuation as can be seen in setting of chronic microvascular ischemic changes. Ventricles and Sulci: Normal for age.  Extra-Axial Spaces: No extra-axial fluid collection. Basal Cisterns: Normal. Paranasal Sinuses: Normal. Mastoids: Normal. Orbits: Bilateral lens replacement. Cranium and Bones: Normal. Soft Tissues: Normal. IMPRESSION: No acute intracranial abnormalities. The preliminary report (critical or emergent communication) was reviewed prior to this dictation and there are no critical differences between the preliminary results and the impressions in this final report. Electronically Reviewed by:  Romilda Pate, MD, [facility] Electronically Reviewed on:  07/01/2024 10:03 PM I have reviewed the images and concur with the above findings. Electronically Signed by:  Alm Horton, MD, [facility] Electronically Signed on:  07/01/2024 10:07 PM  X-ray chest PA and lateral Result Date: 07/01/2024 XR CHEST PA AND LATERAL INDICATION: Lung Aeration, C22.9 Malignant neoplasm of liver, not specified as primary or secondary (CMS/HHS-HCC) COMPARISON: 08/20/2015 chest radiograph. FINDINGS: Support device(s): Right IJ port with tip at the distal SVC. Mediastinum: Stable cardiac and mediastinal contours. Lungs: Eventration of the right hemidiaphragm. No focal consolidation. Pleura: No pleural effusion or pneumothorax. Other: Surgical clips in the bilateral axillae. IMPRESSION: No evidence of consolidation, pleural effusion, or pneumothorax. Electronically Reviewed by:  Hezzie Bellingham, MD, [facility] Electronically Reviewed on:  07/01/2024 8:11 PM I have reviewed the images and concur with the above findings. Electronically Signed by:  Nelwyn Hummer, MD, [facility] Electronically Signed on:  07/01/2024 8:22 PM    Scheduled  Medications    atorvastatin   40 mg Oral QPM   ceFEPIme  (MAXIPIME ) IV Traditional Infusion  1 g Intravenous Q6H   [Held by provider] enoxaparin  40 mg Subcutaneous Daily   ezetimibe   10 mg Oral QPM   famotidine  10 mg Oral QHS   gabapentin   900 mg Oral TID   insulin LISPRO (AdmeLOG, HumaLOG) injection  0-12 Units Subcutaneous TID CC   lipase-protease-amylase (pork)  2 capsule Oral TID CC   loratadine  10 mg Oral Daily   magnesium  oxide  400 mg Oral Daily with lunch   metroNIDAZOLE   500 mg Intravenous Q12H   morphine   15 mg Oral Q12H   pantoprazole   40 mg Oral Daily   PARoxetine   20 mg Oral QHS   potassium chloride IV (central line)  40 mEq Intravenous Once   pyridoxine  (vitamin B6)  100 mg Oral Daily   vancomycin (VANCOCIN) adult IV (peripheral)  1 g Intravenous Q24H     Infusions         PRN Meds   acetaminophen  **OR** acetaminophen , calcium  carbonate, dextrose 50% in water, glucagon, lidocaine , lidocaine , melatonin **OR** melatonin, ondansetron  **OR** ondansetron , oxyCODONE , prochlorperazine **OR** prochlorperazine       ------------------------------------------------------------------------------- Attestation signed by Norleen,  Joshuva, DO at 07/03/2024  4:11 PM Attestation: I personally saw and evaluated the patient. I reviewed the APP's/Fellow/Resident plan and agree with the findings and plan as documented, additional details will be listed below.   Walker Paddack is a 74 year old female with a history of hypertension, hyperlipidemia, type 2 diabetes, prior breast cancer, and recently diagnosed metastatic pancreatic cancer with liver metastases was admitted on 1/20 with fever and transient confusion; blood cultures grew GPC's, and she was started on broad-spectrum antibiotics with port removal and CVC placement for access.   She remains hemodynamically stable and afebrile, with a midline abdominal wound being packed for mild serous drainage, and her pain and  chronic conditions are managed per home regimen.   She continues on antibiotics, supportive care, and oncology follow-up while being monitored for infection resolution and functional recovery.  Joshuva John, DO  ------------------------------------------------------------------------------- "

## 2024-07-03 NOTE — Care Plan (Signed)
" °  Problem: Knowledge deficit: Goal: Patient/caregiver ability to state and carry out methods to decrease the pain will improve Description:      Outcome: Progressing Goal: Patient/caregiver ability to identify factors that increase the pain will improve Outcome: Progressing   Problem: Alteration in comfort: Goal: Patient/caregiver's expressions of having a comfortable level of knowledge regarding medication regimen will improve Outcome: Progressing   Problem: Altered ability to manage pain: Goal: Ability to develop a pain control plan will improve Outcome: Progressing Goal: Satisfaction with pain management regimen will improve Outcome: Progressing   Problem: Safety Goal: Free from accidental physical injury Outcome: Progressing Goal: Free from abuse Outcome: Progressing   Problem: Compromised Skin Integrity Goal: Skin integrity is maintained or improved Description: Assess and monitor skin integrity. Identify patients at risk for skin breakdown on admission and per policy. Collaborate with interdisciplinary team and initiate plans and interventions as needed. Outcome: Progressing Goal: Fluid and electrolyte balance are achieved/maintained Description: Assess and monitor vital signs (orthostatic vitals if applicable), fluid intake and output, urine color, labs, skin turgor, mucous membranes, jugular venous distention, edema, circumference of edematous extremities and abdominal girth, respiratory status, and mental status.  Monitor for signs and symptoms of hypovolemia (tachycardia, rapid breathing, decreased urine output, postural hypotension, confusion, syncope).  Monitor for signs and symptoms of hypervolemia (strong rapid pulse, shortness of breath, difficulty breathing lying down, crackles heard in lung fields, edema). Collaborate with interdisciplinary team and initiate plan and interventions as ordered. Outcome: Progressing Goal: Nutritional status is improving Description:  Monitor and assess patient for malnutrition (ex- brittle hair, bruises, dry skin, pale skin and conjunctiva, muscle wasting, smooth red tongue, and disorientation). Collaborate with interdisciplinary team and initiate plan and interventions as ordered.  Monitor patient's weight and dietary intake as ordered or per policy. Utilize nutrition screening tool and intervene per policy. Determine patient's food preferences and provide high-protein, high-caloric foods as appropriate.  Outcome: Progressing   "

## 2024-07-03 NOTE — Progress Notes (Signed)
 Virtual Nurse Admission Note  Patient is alert and oriented Alert and Oriented: X 4.  No noted acute distress.  Patient admission questions completed with Patient The following were reviewed: Admission Teaching Topics: VRN role and availability Questions answered; Patient actively and appropriately engaged in discussion; able to verbalize understanding of admission education.  The following questions were not completed:   height, weight, vitals, braden scale, patient belongings, BMAT  Care plans reviewed. all appropriate careplans in place at this time, none added at this time.  Care nurse notified of completion of admission questions and education.  Interpreter needed for session?No

## 2024-07-03 NOTE — Progress Notes (Signed)
 " Physical Therapy Progress Note  Patient Name:  Jacqueline Warren Date of Therapy Session: 07/03/24 Time of Therapy Session:  1022 Duration of Session:  23 Minutes Room/Bed: 7109/7109-01  Precautions: Falls Risk, Abdominal precautions   Assessment: Patient presents with good underlying strength and mobility. Setup with RW for mobility. Able to ambulate 300' with stand by assist. During ambulation able to clear 2 stairs with stand by assist. Encouraged to ambulate with nursing staff with RW or rollator. Patient motivated to continue to mobilize  The patient will continue to benefit from the skills of a physical therapist to address Decreased endurance/activity tolerance.   Recommendations for mobility with nursing:  Use BMAT score and associated clinical judgement to determine safe mobility on a daily basis as patient status may be subject to change.  Safe to ambulate with staff.  Discharge Recommendations: Is the patient safe to discharge to the recommended disposition? Yes Discharge Recommendations: Home, Home Health PT DME Recommendations    Flowsheet Row Most Recent Value  DME Recommendations None Filed at 07/03/2024 1022     Complete details of today's session: At end of session, the patient was left seated in recliner at bedside, lower extremities elevated in recliner, with all needs in reach, with nurse call device in reach. Her status was communicated to the RN, Patient.     07/03/24 1022  Discipline Timestamp  Discipline Timestamp PT  Documentation Type     Documentation Type                                E,R, T   Treatment  Patient Subjective Information  Patient Subjective Information Patient agreeable to therapy  Precautions  Precautions Falls Risk;Abdominal precautions  Patient/Family Goals  Patient/Family Goals Go home;Take care of myself  Vital Signs  Heart Rate 85  Heart Rate Source Monitor  BP 124/74  BP Location Left calf  BP Method Automatic  Patient  Position Lying  Pain Assessment  Pain Assessment %% 0-10  Pain Score %% Three  Pain Type Chronic pain  Pain Loc FOOT  Pain Orientation Bilateral  Pain Descriptors Other (Comment) (neuropathic)  Activity At Time Of Vitals Measurement  Activity Rest  Review of Systems Cardiovascular/Pulmonary Function  SpO2 95 %  Any supplemental oxygen? No  Mobility  Bed Mobility  Supine to Sit;Sit to Stand;Stand to Sit       Supine to Sit Assistance Supervision       Supine to Sit Details Set up       Number of People Required 1       Sit to Stand Assistance Stand by assist       Sit to Stand Details Set up;Requires extra time       Number of People Required 1       Sit to Stand Assistive Devices Walker       Walker type Rolling       Stand to [employer] by assist       Stand to Sit Details Requires extra time;Set up       Number of People Required 1       Stand to Sit Assistive Devices Walker       Walker type [employer] Yes       Ambulation Assistance Stand by assist       Number of People Required 1  Distance ambulated in feet 300 feet  Ambulation  Assistive Device Lobbyist type Research Scientist (life Sciences)  Stairs/Curb assessed Yes  Rails  Left rail going up  Assistance  Stand by assist  Comment/# Steps  2  Adult PT Outcomes  Highest Level of Function Yes  Highest Level of Activity 9- Walking A x 1  Time in Highest Level of Activity 6-10 min  Adverse Events 0- None  Inpatient AM-PAC Performed Basic Mobility Inpatient Short Form - 6 Clicks  AM-PAC 6 Clicks Basic Mobility Inpatient Short Form  Turning from your back to your side while in a flat bed without using bedrails? 4-None  Moving from lying on your back to sitting on the side of a flat bed without using bedrails? 4-None  Moving to and from a bed to a chair (including a wheelchair)? 4-None  Standing up from a chair using using your arms (e.g,. wheelchair, or bedside chair)? 4-None  To walk in  hospital room? 4-None  Climbing 3-5 steps with a railing? 3-A Little  AM-PAC Basic Mobility Raw Score 23  AM-PAC Basic Mobility t-Scale Score 50.88  AM-PAC Basic Mobility G-Code Modifier CI  Patient Status At End of Session  Status Communicated to: RN;Patient  Pt Left: seated in recliner at bedside;lower extremities elevated in recliner;with all needs in reach;with nurse call device in reach  Assessment  PT Enhancers Good family support/resources;Motivated  PT Barriers Pain;Home environmental barriers;Decreased activity tolerance/medically complex/comorbidities;Skin integrity/wounds/pressure ulcers  Impairments/Functional Limitations    Decreased endurance/activity tolerance  Rehab Potential   Good  Patient safe for DC/PT perspective? Yes  Summary of Findings  Tolerated treatment well  Plan  Treatment/Interventions Bed mobility;Transfers;Therapeutic exercise;Stair training;Therapeutic devices and equipment;Patient and family education;Gait training;Endurance training  PT Frequency 3x per week  Discharge Recommendation (DU[facility]/DRH) Home;Home Health PT  DME Recommendations None  Plan(Progress Note) Continue current plan of care      Please see patient education record for PT teaching completed today.   TANDA GULA, PT     "

## 2024-07-04 NOTE — Care Plan (Signed)
" °  Problem: Knowledge deficit: Goal: Patient/caregiver ability to state and carry out methods to decrease the pain will improve Description:      Outcome: Progressing Note: Patient satisfied with pain goal. Pain Intervention(s): Medication (PO/VT short acting opioid given)  Goal: Patient/caregiver ability to identify factors that increase the pain will improve Outcome: Progressing Note: Patient satisfied with pain goal. Pain Intervention(s): Medication (PO/VT short acting opioid given)    Problem: Alteration in comfort: Goal: Patient/caregiver's expressions of having a comfortable level of knowledge regarding medication regimen will improve Outcome: Progressing Note: Patient satisfied with pain goal. Pain Intervention(s): Medication (PO/VT short acting opioid given)    Problem: Altered ability to manage pain: Goal: Ability to develop a pain control plan will improve Outcome: Progressing Note: Patient satisfied with pain goal. Pain Intervention(s): Medication (PO/VT short acting opioid given)  Goal: Satisfaction with pain management regimen will improve Outcome: Progressing Note: Patient satisfied with pain goal. Pain Intervention(s): Medication (PO/VT short acting opioid given)    Problem: Safety Goal: Free from accidental physical injury Outcome: Progressing Goal: Free from abuse Outcome: Progressing   Problem: Compromised Skin Integrity Goal: Skin integrity is maintained or improved Description: Assess and monitor skin integrity. Identify patients at risk for skin breakdown on admission and per policy. Collaborate with interdisciplinary team and initiate plans and interventions as needed. Outcome: Progressing Goal: Fluid and electrolyte balance are achieved/maintained Description: Assess and monitor vital signs (orthostatic vitals if applicable), fluid intake and output, urine color, labs, skin turgor, mucous membranes, jugular venous distention, edema, circumference of  edematous extremities and abdominal girth, respiratory status, and mental status.  Monitor for signs and symptoms of hypovolemia (tachycardia, rapid breathing, decreased urine output, postural hypotension, confusion, syncope).  Monitor for signs and symptoms of hypervolemia (strong rapid pulse, shortness of breath, difficulty breathing lying down, crackles heard in lung fields, edema). Collaborate with interdisciplinary team and initiate plan and interventions as ordered. Outcome: Progressing Goal: Nutritional status is improving Description: Monitor and assess patient for malnutrition (ex- brittle hair, bruises, dry skin, pale skin and conjunctiva, muscle wasting, smooth red tongue, and disorientation). Collaborate with interdisciplinary team and initiate plan and interventions as ordered.  Monitor patient's weight and dietary intake as ordered or per policy. Utilize nutrition screening tool and intervene per policy. Determine patient's food preferences and provide high-protein, high-caloric foods as appropriate.  Outcome: Progressing   "

## 2024-07-04 NOTE — Procedures (Signed)
 VAT: Consult for Blood Cultures  Patient has had bilateral axillary node biopsy and dissection in past - unable to stick either arm. Also has DM, so unable to stick feet. VAT notes dating back from 2024 reinforce that only PIV we could place would be EJ. Spoke to [facility] - Tita de Clint Abbey Sprung - as well as Care RN - waiting to determine further course of action.
